# Patient Record
Sex: Male | Born: 1950 | Race: White | Hispanic: No | Marital: Married | Smoking: Current every day smoker
Health system: Southern US, Community
[De-identification: ages and names within clinical notes are randomized; demographics above are authoritative.]

## PROBLEM LIST (undated history)

## (undated) DIAGNOSIS — I10 Essential (primary) hypertension: Secondary | ICD-10-CM

## (undated) DIAGNOSIS — J449 Chronic obstructive pulmonary disease, unspecified: Secondary | ICD-10-CM

## (undated) DIAGNOSIS — Z720 Tobacco use: Secondary | ICD-10-CM

## (undated) DIAGNOSIS — R9430 Abnormal result of cardiovascular function study, unspecified: Secondary | ICD-10-CM

## (undated) DIAGNOSIS — IMO0002 Reserved for concepts with insufficient information to code with codable children: Secondary | ICD-10-CM

## (undated) DIAGNOSIS — I779 Disorder of arteries and arterioles, unspecified: Secondary | ICD-10-CM

## (undated) DIAGNOSIS — Z951 Presence of aortocoronary bypass graft: Secondary | ICD-10-CM

## (undated) DIAGNOSIS — K219 Gastro-esophageal reflux disease without esophagitis: Secondary | ICD-10-CM

## (undated) DIAGNOSIS — I255 Ischemic cardiomyopathy: Secondary | ICD-10-CM

## (undated) DIAGNOSIS — E785 Hyperlipidemia, unspecified: Secondary | ICD-10-CM

## (undated) DIAGNOSIS — K2270 Barrett's esophagus without dysplasia: Secondary | ICD-10-CM

## (undated) DIAGNOSIS — Z789 Other specified health status: Secondary | ICD-10-CM

## (undated) DIAGNOSIS — I2510 Atherosclerotic heart disease of native coronary artery without angina pectoris: Secondary | ICD-10-CM

## (undated) DIAGNOSIS — I739 Peripheral vascular disease, unspecified: Secondary | ICD-10-CM

## (undated) HISTORY — DX: Barrett's esophagus without dysplasia: K22.70

## (undated) HISTORY — DX: Hyperlipidemia, unspecified: E78.5

## (undated) HISTORY — DX: Abnormal result of cardiovascular function study, unspecified: R94.30

## (undated) HISTORY — DX: Presence of aortocoronary bypass graft: Z95.1

## (undated) HISTORY — DX: Reserved for concepts with insufficient information to code with codable children: IMO0002

## (undated) HISTORY — DX: Atherosclerotic heart disease of native coronary artery without angina pectoris: I25.10

## (undated) HISTORY — DX: Gastro-esophageal reflux disease without esophagitis: K21.9

## (undated) HISTORY — DX: Disorder of arteries and arterioles, unspecified: I77.9

## (undated) HISTORY — PX: CORONARY ARTERY BYPASS GRAFT: SHX141

## (undated) HISTORY — DX: Peripheral vascular disease, unspecified: I73.9

## (undated) HISTORY — PX: CAROTID ARTERY ANGIOPLASTY: SHX1300

## (undated) HISTORY — PX: CARDIAC SURGERY: SHX584

## (undated) HISTORY — PX: COLONOSCOPY: SHX174

## (undated) HISTORY — PX: HERNIA REPAIR: SHX51

## (undated) HISTORY — PX: UPPER GASTROINTESTINAL ENDOSCOPY: SHX188

## (undated) HISTORY — DX: Tobacco use: Z72.0

## (undated) HISTORY — DX: Essential (primary) hypertension: I10

## (undated) HISTORY — DX: Ischemic cardiomyopathy: I25.5

## (undated) HISTORY — DX: Chronic obstructive pulmonary disease, unspecified: J44.9

## (undated) HISTORY — DX: Other specified health status: Z78.9

---

## 2000-03-09 ENCOUNTER — Encounter (INDEPENDENT_AMBULATORY_CARE_PROVIDER_SITE_OTHER): Payer: Self-pay

## 2000-03-09 ENCOUNTER — Other Ambulatory Visit: Admission: RE | Admit: 2000-03-09 | Discharge: 2000-03-09 | Payer: Self-pay | Admitting: Otolaryngology

## 2001-06-21 ENCOUNTER — Ambulatory Visit (HOSPITAL_COMMUNITY): Admission: RE | Admit: 2001-06-21 | Discharge: 2001-06-21 | Payer: Self-pay | Admitting: Internal Medicine

## 2002-07-12 ENCOUNTER — Ambulatory Visit (HOSPITAL_COMMUNITY): Admission: RE | Admit: 2002-07-12 | Discharge: 2002-07-12 | Payer: Self-pay | Admitting: Internal Medicine

## 2002-12-06 ENCOUNTER — Ambulatory Visit (HOSPITAL_COMMUNITY): Admission: RE | Admit: 2002-12-06 | Discharge: 2002-12-07 | Payer: Self-pay | Admitting: Cardiology

## 2003-07-25 ENCOUNTER — Ambulatory Visit (HOSPITAL_COMMUNITY): Admission: RE | Admit: 2003-07-25 | Discharge: 2003-07-25 | Payer: Self-pay | Admitting: Internal Medicine

## 2004-03-03 ENCOUNTER — Ambulatory Visit: Payer: Self-pay | Admitting: Internal Medicine

## 2004-03-14 ENCOUNTER — Ambulatory Visit: Payer: Self-pay | Admitting: Internal Medicine

## 2004-03-14 ENCOUNTER — Ambulatory Visit (HOSPITAL_COMMUNITY): Admission: RE | Admit: 2004-03-14 | Discharge: 2004-03-14 | Payer: Self-pay | Admitting: Internal Medicine

## 2004-07-16 ENCOUNTER — Ambulatory Visit: Payer: Self-pay | Admitting: Cardiology

## 2004-07-22 ENCOUNTER — Encounter: Payer: Self-pay | Admitting: Cardiology

## 2004-07-30 ENCOUNTER — Ambulatory Visit (HOSPITAL_COMMUNITY): Admission: RE | Admit: 2004-07-30 | Discharge: 2004-07-31 | Payer: Self-pay | Admitting: Vascular Surgery

## 2004-07-30 ENCOUNTER — Encounter (INDEPENDENT_AMBULATORY_CARE_PROVIDER_SITE_OTHER): Payer: Self-pay | Admitting: Specialist

## 2004-07-30 HISTORY — PX: CAROTID ENDARTERECTOMY: SUR193

## 2005-05-07 ENCOUNTER — Ambulatory Visit: Payer: Self-pay | Admitting: Internal Medicine

## 2005-07-15 ENCOUNTER — Ambulatory Visit: Payer: Self-pay | Admitting: Cardiology

## 2005-07-21 ENCOUNTER — Ambulatory Visit: Payer: Self-pay | Admitting: Cardiology

## 2005-07-22 ENCOUNTER — Encounter: Payer: Self-pay | Admitting: Vascular Surgery

## 2005-07-22 ENCOUNTER — Ambulatory Visit (HOSPITAL_COMMUNITY): Admission: RE | Admit: 2005-07-22 | Discharge: 2005-07-22 | Payer: Self-pay | Admitting: Cardiovascular Disease

## 2005-07-22 ENCOUNTER — Ambulatory Visit: Payer: Self-pay | Admitting: Cardiovascular Disease

## 2005-07-22 ENCOUNTER — Inpatient Hospital Stay (HOSPITAL_BASED_OUTPATIENT_CLINIC_OR_DEPARTMENT_OTHER): Admission: RE | Admit: 2005-07-22 | Discharge: 2005-07-22 | Payer: Self-pay | Admitting: Cardiovascular Disease

## 2005-08-03 ENCOUNTER — Inpatient Hospital Stay (HOSPITAL_COMMUNITY): Admission: RE | Admit: 2005-08-03 | Discharge: 2005-08-06 | Payer: Self-pay | Admitting: Cardiothoracic Surgery

## 2005-08-17 ENCOUNTER — Ambulatory Visit: Payer: Self-pay | Admitting: Cardiology

## 2005-09-09 ENCOUNTER — Encounter: Payer: Self-pay | Admitting: Cardiology

## 2005-09-17 ENCOUNTER — Ambulatory Visit: Payer: Self-pay | Admitting: Cardiology

## 2006-06-16 ENCOUNTER — Ambulatory Visit: Payer: Self-pay | Admitting: Cardiology

## 2006-09-06 ENCOUNTER — Encounter (INDEPENDENT_AMBULATORY_CARE_PROVIDER_SITE_OTHER): Payer: Self-pay | Admitting: Internal Medicine

## 2006-09-06 ENCOUNTER — Ambulatory Visit: Payer: Self-pay | Admitting: Internal Medicine

## 2006-09-06 ENCOUNTER — Ambulatory Visit (HOSPITAL_COMMUNITY): Admission: RE | Admit: 2006-09-06 | Discharge: 2006-09-06 | Payer: Self-pay | Admitting: Internal Medicine

## 2006-09-21 IMAGING — CR DG CHEST 2V
2 series · 2 of 2 positions shown · non-contrast
Comparison: 07/29/04.

CLINICAL DATA: Preop for coronary artery disease surgery, hypertension, smoking history. 
 CHEST - 2 VIEW ? 07/30/05:

[view not recorded (1 of 2)]
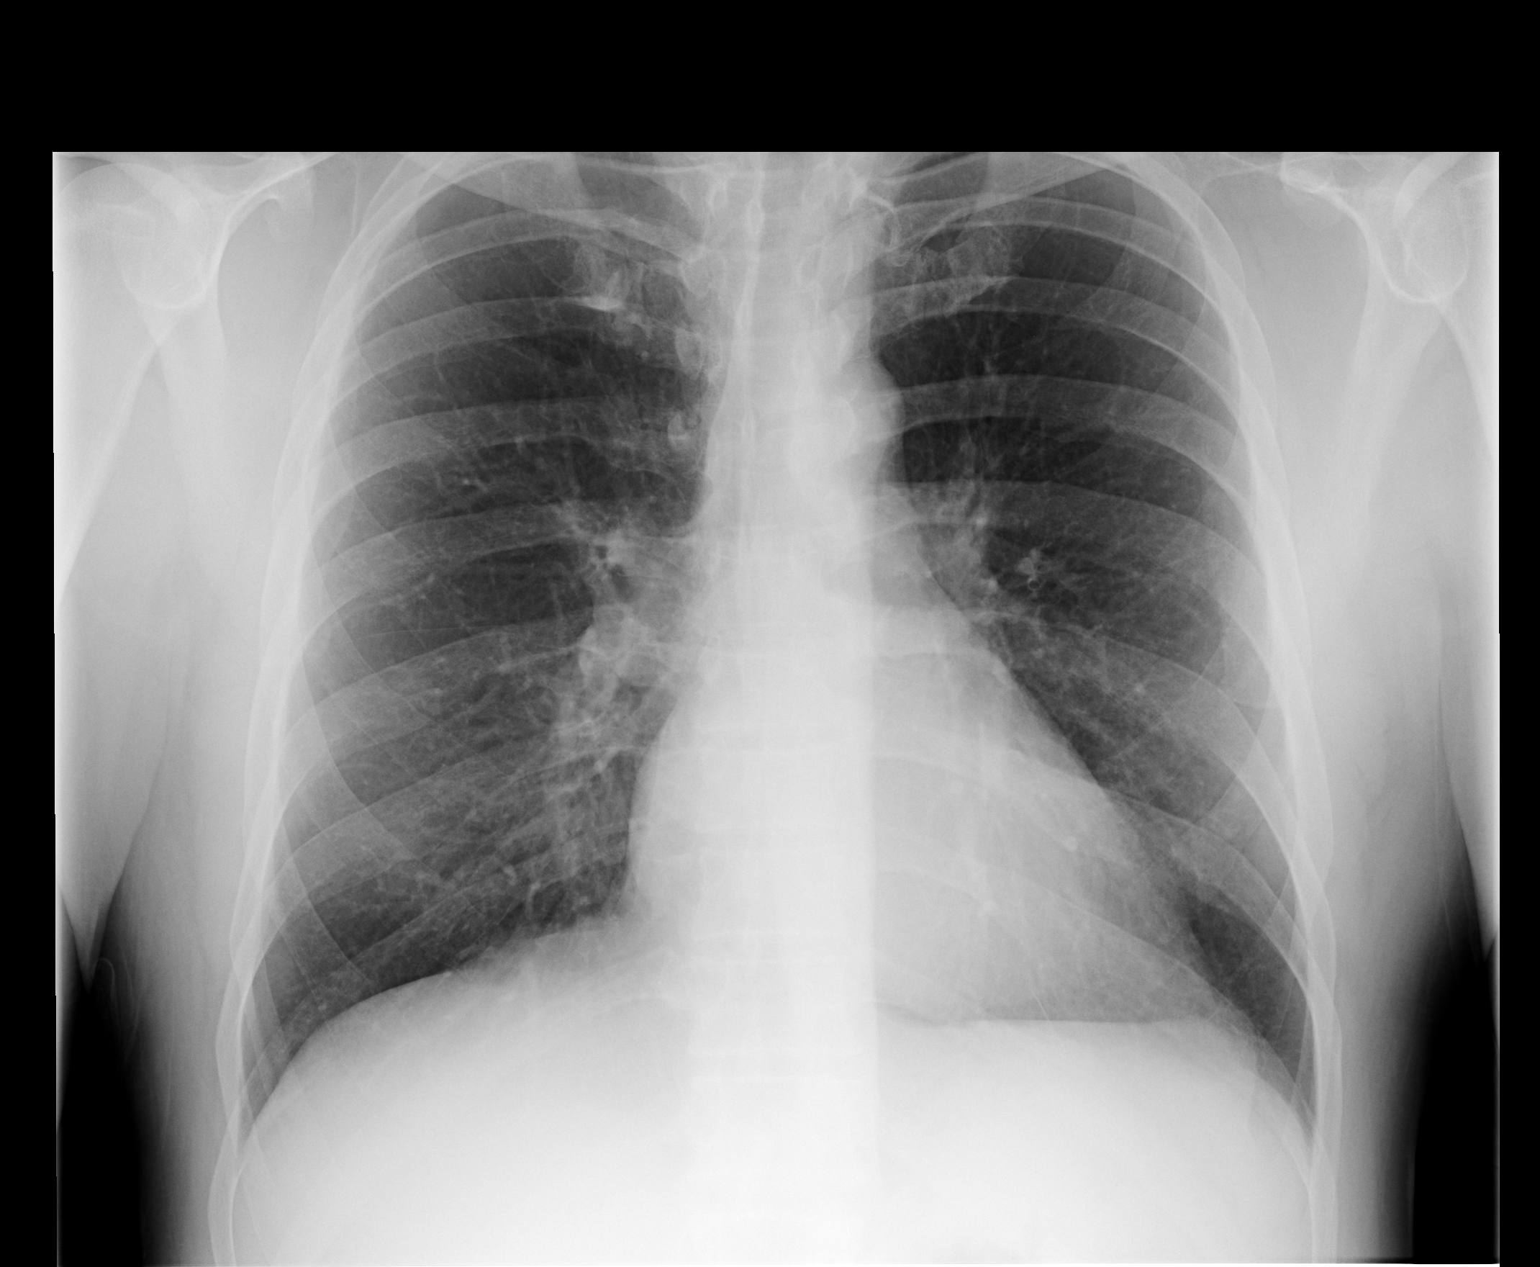

[view not recorded (2 of 2)]
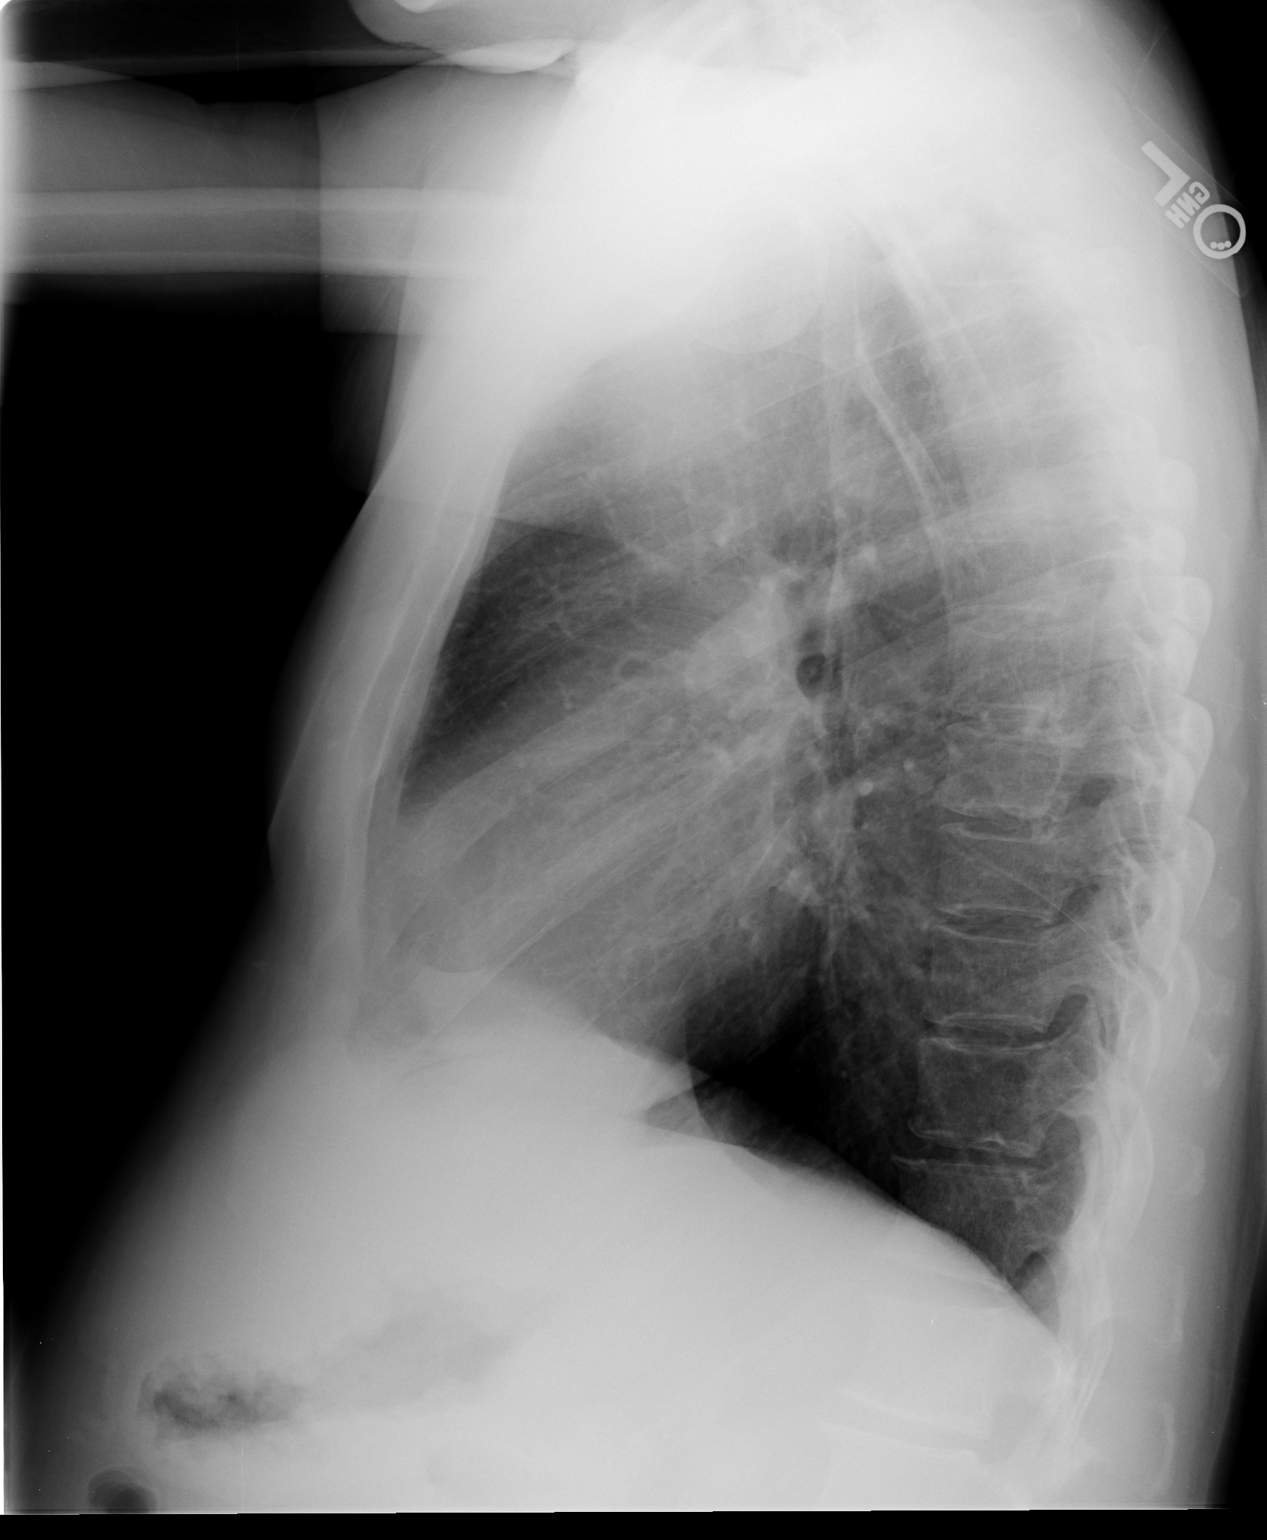

[2 of 2 positions shown; findings below may reference images not displayed]

Two views of the chest show the lungs to be clear. The heart is within upper limits of normal.  No bony abnormal is seen.
IMPRESSION: Stable chest x-ray.  No active lung disease.

## 2007-03-22 ENCOUNTER — Ambulatory Visit: Payer: Self-pay | Admitting: Vascular Surgery

## 2007-04-05 ENCOUNTER — Ambulatory Visit: Payer: Self-pay | Admitting: Vascular Surgery

## 2007-04-14 ENCOUNTER — Ambulatory Visit: Payer: Self-pay | Admitting: Cardiology

## 2007-04-22 ENCOUNTER — Observation Stay (HOSPITAL_COMMUNITY): Admission: RE | Admit: 2007-04-22 | Discharge: 2007-04-23 | Payer: Self-pay | Admitting: Vascular Surgery

## 2007-04-22 ENCOUNTER — Ambulatory Visit: Payer: Self-pay | Admitting: Vascular Surgery

## 2007-04-22 HISTORY — PX: CAROTID ENDARTERECTOMY: SUR193

## 2007-05-03 ENCOUNTER — Ambulatory Visit: Payer: Self-pay | Admitting: Vascular Surgery

## 2007-09-01 ENCOUNTER — Ambulatory Visit: Payer: Self-pay | Admitting: Cardiology

## 2007-11-08 ENCOUNTER — Ambulatory Visit: Payer: Self-pay | Admitting: Vascular Surgery

## 2008-05-15 ENCOUNTER — Ambulatory Visit: Payer: Self-pay | Admitting: Vascular Surgery

## 2008-10-12 ENCOUNTER — Ambulatory Visit: Payer: Self-pay | Admitting: Cardiology

## 2009-05-07 ENCOUNTER — Ambulatory Visit: Payer: Self-pay | Admitting: Vascular Surgery

## 2009-11-21 ENCOUNTER — Telehealth (INDEPENDENT_AMBULATORY_CARE_PROVIDER_SITE_OTHER): Payer: Self-pay | Admitting: *Deleted

## 2010-04-16 ENCOUNTER — Encounter: Payer: Self-pay | Admitting: Cardiology

## 2010-04-16 ENCOUNTER — Ambulatory Visit
Admission: RE | Admit: 2010-04-16 | Discharge: 2010-04-16 | Payer: Self-pay | Source: Home / Self Care | Attending: Cardiology | Admitting: Cardiology

## 2010-04-18 ENCOUNTER — Encounter: Payer: Self-pay | Admitting: Cardiology

## 2010-04-29 NOTE — Progress Notes (Signed)
Summary: ?? hold meds   Phone Note From Other Clinic   Caller: Dewayne Hatch, Nurse Request: Talk with Nurse Summary of Call: Ann from Dr. Patty Sermons office called to talk about Benjamin [name] having surgery. She needs to comunicate about stopping some of his meds.  please call Ann at [phone] Initial call taken by: Alexis Goodell  Follow-up for Phone Call        Received fax from [facility] with Dr. Karilyn Cota.  Want to know if can stop Plavix 5 days prior to and Aspirin 2 days prior to colonoscopy on 9/13.   Hoover Brunette, LPN  November 22, 2009 4:40 PM   Additional Follow-up for Phone Call Additional follow up Details #1::        yes but both need to be restarted as soon as possible after procedure particularly aspirin if possible, aspirin should be continued during procedure but if not, restart as soon as possible.  The patient is status post bypass surgery in his drug-eluting stent was placed in 2004 so stopping Plavix should be safe. Additional Follow-up by: Lewayne Bunting, MD, [facility],  November 24, 2009 5:54 PM

## 2010-05-01 NOTE — Assessment & Plan Note (Addendum)
Summary: 1 YR FUL   Visit Type:  Follow-up Primary Provider:  Roxine Caddy   History of Present Illness: the patient is a 60 year old male with a history of an ischemic cardiomyopathy ejection fraction 35-40%, status post coronary artery bypass grafting in 2006 and status post ICD implant in 2005. The patient reports no recurrent substernal chest pain either at rest or on exertion. He did describe an episode several weeks ago of severe cramping under her left axilla this abated spontaneously. The patient also significant carotid disease and still followed by Dr. Hart Rochester. He also has depression but states he does not feel good on Lexapro. He reports vivid dreams and feels even more depressed. He does report shortness of breath on exertion which is chronic. Unfortunate patient continues to smoke. He did have screening for abdominal aortic aneurysm in July of 2011 which was negative. His only cardiovascular complaint currently is an occasional sensation of "fluttering in his chest". Symptoms typically lasts 10 minutes and then abated spontaneously. There are no associated symptoms of shortness of breath presyncope or syncope.  Preventive Screening-Counseling & Management  Alcohol-Tobacco     Smoking Status: current     Smoking Cessation Counseling: yes     Packs/Day: 3 PPD  Current Medications (verified): 1)  Metoprolol Tartrate 25 Mg Tabs (Metoprolol Tartrate) .... Take 1/2 Tablet By Mouth Twice A Day 2)  Plavix 75 Mg Tabs (Clopidogrel Bisulfate) .... Take 1 Tablet By Mouth Once A Day 3)  Metformin Hcl 500 Mg Xr24h-Tab (Metformin Hcl) .... Take 3 Tablet By Mouth Once A Day 4)  Vytorin 10-40 Mg Tabs (Ezetimibe-Simvastatin) .... Take 1 Tablet By Mouth Once A Day 5)  Aspir-Low 81 Mg Tbec (Aspirin) .... Take 1 Tablet By Mouth Once A Day 6)  Nexium 40 Mg Cpdr (Esomeprazole Magnesium) .... Take 1 Tablet By Mouth Once A Day 7)  Lisinopril-Hydrochlorothiazide 20-12.5 Mg Tabs (Lisinopril-Hydrochlorothiazide)  .... Take 1 Tablet By Mouth Once A Day 8)  Neurontin 600 Mg Tabs (Gabapentin) .... Take 1 Tablet By Mouth Two Times A Day  Allergies (verified): No Known Drug Allergies  Comments:  Nurse/Medical Assistant: The patient's medication bottles and allergies were reviewed with the patient and were updated in the Medication and Allergy Lists.  Past History:  Past Surgical History: Last updated: 12/06/2008 Right inguinal hernia repair.  CABG Carotid Endarterectomy Tonsillectomy  Family History: Last updated: 12/06/2008 Family History of Cancer:  Family History of Coronary Artery Disease:  Family History of Hypertension:   Social History: Last updated: 12/06/2008 Retired  Married  Tobacco Use - Yes.  Alcohol Use - yes  Risk Factors: Smoking Status: current (04/16/2010) Packs/Day: 3 PPD (04/16/2010)  Past Medical History: HYPERTENSION, UNSPECIFIED (ICD-401.9) HYPERLIPIDEMIA-MIXED (ICD-272.4) CAD, ARTERY BYPASS GRAFT (ICD-414.04) Severe cerebrovascular disease status post left and right carotid endarterectomy followed by Dr. Hart Rochester Type 2 diabetes mellitus GERD Abdominal ultrasound July 2011 negative or AAA Echocardiogram February 2011 ejection fraction 35-40%, abnormal diastolic function, segmental wall motion abnormalities, ICD lead present, no definite pulmonary hypertension.  Social History: Packs/Day:  3 PPD  Review of Systems       The patient complains of shortness of breath and depression.  The patient denies fatigue, malaise, fever, weight gain/loss, vision loss, decreased hearing, hoarseness, chest pain, palpitations, prolonged cough, wheezing, sleep apnea, coughing up blood, abdominal pain, blood in stool, nausea, vomiting, diarrhea, heartburn, incontinence, blood in urine, muscle weakness, joint pain, leg swelling, rash, skin lesions, headache, fainting, dizziness, anxiety, enlarged lymph nodes, easy bruising or  bleeding, and environmental allergies.    Vital  Signs:  Patient profile:   60 year old male Height:      68 inches Weight:      165 pounds BMI:     25.18 Pulse rate:   105 / minute BP sitting:   147 / 85  (right arm) Cuff size:   regular  Vitals Entered By: Carlye Grippe (April 16, 2010 2:28 PM)  Nutrition Counseling: Patient's BMI is greater than 25 and therefore counseled on weight management options.  Physical Exam  Additional Exam:  General: Well-developed, well-nourished in no distress head: Normocephalic and atraumatic eyes PERRLA/EOMI intact, conjunctiva and lids normal nose: No deformity or lesions mouth normal dentition, normal posterior pharynx neck: Supple, no JVD.  No masses, thyromegaly or abnormal cervical nodes, status post bilateral carotid endarterectomy scars, left carotid bruit lungs: Normal breath sounds bilaterally without wheezing.  Normal percussion heart: regular rate and rhythm with normal S1 and S2, no S3 or S4.  PMI is normal.  No pathological murmurs abdomen: Normal bowel sounds, abdomen is soft and nontender without masses, organomegaly or hernias noted.  No hepatosplenomegaly musculoskeletal: Back normal, normal gait muscle strength and tone normal pulsus: Pulse is normal in all 4 extremities Extremities: No peripheral pitting edema neurologic: Alert and oriented x 3 skin: Intact without lesions or rashes cervical nodes: No significant adenopathy psychologic: Normal affect    Impression & Recommendations:  Problem # 1:  CAD, ARTERY BYPASS GRAFT (ICD-414.04) no recurrent chest pain. Patient had a stress test in 2009. Currently does not need further evaluation for ischemia The following medications were removed from the medication list:    Altace 2.5 Mg Caps (Ramipril) His updated medication list for this problem includes:    Metoprolol Tartrate 25 Mg Tabs (Metoprolol tartrate) .Marland Kitchen... Take 1/2 tablet by mouth twice a day    Plavix 75 Mg Tabs (Clopidogrel bisulfate) .Marland Kitchen... Take 1 tablet by  mouth once a day    Aspir-low 81 Mg Tbec (Aspirin) .Marland Kitchen... Take 1 tablet by mouth once a day    Lisinopril-hydrochlorothiazide 20-12.5 Mg Tabs (Lisinopril-hydrochlorothiazide) .Marland Kitchen... Take 1 tablet by mouth once a day  Orders: EKG w/ Interpretation (93000)  Problem # 2:  HYPERLIPIDEMIA-MIXED (ICD-272.4) patient has not had a lipid panel done in quite some time. We ordered a lipid panel and LFTs. His updated medication list for this problem includes:    Vytorin 10-40 Mg Tabs (Ezetimibe-simvastatin) .Marland Kitchen... Take 1 tablet by mouth once a day  Problem # 3:  HYPERTENSION, UNSPECIFIED (ICD-401.9) blood pressures were controlled. Continue current medical regimen. The following medications were removed from the medication list:    Altace 2.5 Mg Caps (Ramipril) His updated medication list for this problem includes:    Metoprolol Tartrate 25 Mg Tabs (Metoprolol tartrate) .Marland Kitchen... Take 1/2 tablet by mouth twice a day    Aspir-low 81 Mg Tbec (Aspirin) .Marland Kitchen... Take 1 tablet by mouth once a day    Lisinopril-hydrochlorothiazide 20-12.5 Mg Tabs (Lisinopril-hydrochlorothiazide) .Marland Kitchen... Take 1 tablet by mouth once a day  Problem # 4:  AORTIC ANEUR UNSPEC SITE WITHOUT MENTION RUPTURE (ICD-441.9) patient has been evaluated with a one-time screening for abdominal aortic aneurysm and this is negative. He does not need any further screening in the future.  Other Orders: Ultrasound (Ultrasound)  Patient Instructions: 1)  Metoprolol 25mg  - 1/2 tab two times a day 2)  Plavix refill 3)  Abdominal Ultrasound 4)  Follow up in  6 months Prescriptions: PLAVIX 75 MG  TABS (CLOPIDOGREL BISULFATE) Take 1 tablet by mouth once a day  #90 x 3   Entered by:   Hoover Brunette, LPN   Authorized by:   Lewayne Bunting, MD, [facility]   Signed by:   Hoover Brunette, LPN on 16/12/9602   Method used:   Print then Give to Patient   RxID:   [id number] METOPROLOL TARTRATE 25 MG TABS (METOPROLOL TARTRATE) Take 1/2 tablet by mouth twice a day  #90 x 3    Entered by:   Hoover Brunette, LPN   Authorized by:   Lewayne Bunting, MD, [facility]   Signed by:   Hoover Brunette, LPN on 21/30/8657   Method used:   Print then Give to Patient   RxID:   [id number]   Appended Document: 1 YR FUL please disregard the note above. The note was dictated on the wrong patient.  In summary the current patient is a 60 year old male with a history of multivessel coronary artery status post coronary bypass grafting in 2007. Prior taxus stenting of the ostial LAD in 2004. At the negative stress test in 2009. He has known cerebrovascular disease status post bilateral carotid endarterectomies. He has COPD. He has remained stable. Next  His physical exam is unchanged from prior. The patient will be referred however for abdominal ultrasound to rule out AAA.

## 2010-05-06 ENCOUNTER — Other Ambulatory Visit (INDEPENDENT_AMBULATORY_CARE_PROVIDER_SITE_OTHER): Payer: 59

## 2010-05-06 DIAGNOSIS — Z48812 Encounter for surgical aftercare following surgery on the circulatory system: Secondary | ICD-10-CM

## 2010-05-06 DIAGNOSIS — I6529 Occlusion and stenosis of unspecified carotid artery: Secondary | ICD-10-CM

## 2010-05-12 NOTE — Procedures (Unsigned)
CAROTID DUPLEX EXAM  INDICATION:  Bilateral carotid endarterectomies.  HISTORY: Diabetes:  Yes. Cardiac:  CABG. Hypertension:  Yes. Smoking:  Yes. Previous Surgery:  Right carotid endarterectomy on 07/30/2004, left carotid endarterectomy on 04/22/2007. CV History:  Currently asymptomatic. Amaurosis Fugax No, Paresthesias No, Hemiparesis No                                      RIGHT             LEFT Brachial systolic pressure:         126               116 Brachial Doppler waveforms:         Normal            Normal Vertebral direction of flow:        Antegrade         Antegrade DUPLEX VELOCITIES (cm/sec) CCA peak systolic                   126               75 ECA peak systolic                   111               84 ICA peak systolic                   120               115 ICA end diastolic                   36                36 PLAQUE MORPHOLOGY: PLAQUE AMOUNT:                      None              None PLAQUE LOCATION:  IMPRESSION: 1. Patent bilateral carotid endarterectomy sites with no bilateral     internal carotid artery stenosis.  Mildly increased velocities     noted in the bilateral mid internal carotid arteries, however, this     appears to be most likely due to a change in vessel diameter. 2. No significant change noted when compared to the previous exam on     05/07/2009.  ___________________________________________ Quita Skye. Hart Rochester, M.D.  CH/MEDQ  D:  05/06/2010  T:  05/06/2010  Job:  [id number]

## 2010-08-12 NOTE — Procedures (Signed)
CAROTID DUPLEX EXAM   INDICATION:  Followup known carotid artery disease.   HISTORY:  Diabetes:  Yes.  Cardiac:  CABG 08/03/2005.  Hypertension:  Yes.  Smoking:  Yes.  Previous Surgery:  Right carotid endarterectomy on 07/30/2004 and left  carotid endarterectomy on 04/22/2007.  CV History:  No.  Amaurosis Fugax No, Paresthesias No, Hemiparesis No                                       RIGHT             LEFT  Brachial systolic pressure:         124               118  Brachial Doppler waveforms:         WNL               WNL  Vertebral direction of flow:        Antegrade         Antegrade  DUPLEX VELOCITIES (cm/sec)  CCA peak systolic                   184               159  ECA peak systolic                   165               135  ICA peak systolic                   134 distal        141 mid  ICA end diastolic                   43                37  PLAQUE MORPHOLOGY:  PLAQUE AMOUNT:  PLAQUE LOCATION:   IMPRESSION:  1. Patent bilateral internal carotid arteries with no restenosis noted      status post endarterectomies.  2. Right external carotid artery stenosis.  3. Antegrade flow in bilateral vertebrals.   ___________________________________________  Quita Skye Hart Rochester, M.D.   CB/MEDQ  D:  05/07/2009  T:  05/08/2009  Job:  [id number]

## 2010-08-12 NOTE — Op Note (Signed)
NAMEDEMETRES, Benjamin [name]               ACCOUNT NO.:  [id number]   MEDICAL RECORD NO.:  [id number]          PATIENT TYPE:  INP   LOCATION:  2550                         FACILITY:  [facility]   PHYSICIAN:  Quita Skye. Hart Rochester, M.D.  DATE OF BIRTH:  1950-10-26   DATE OF PROCEDURE:  04/22/2007  DATE OF DISCHARGE:                               OPERATIVE REPORT   PREOPERATIVE DIAGNOSIS:  Severe left internal carotid stenosis --  asymptomatic.   POSTOPERATIVE DIAGNOSES:  1. Severe left internal carotid stenosis -- asymptomatic.  2. Extensive plaque in left common carotid artery.   PROCEDURE:  Extensive left common and internal carotid endarterectomy  with Dacron patch angioplasty.   SURGEON:  Quita Skye. Hart Rochester, M.D.   FIRST ASSISTANT:  Nurse.   ANESTHESIA:  General endotracheal.   BRIEF HISTORY:  This patient has previously undergone a right carotid  endarterectomy by me in 2006 and had a moderate left internal carotid  stenosis followed, which now has progressed to greater than 95% in  severity, with some elevated velocities in the left common carotid as  well.  He has no significant flow reduction on the right side and he is  scheduled for endarterectomy of this asymptomatic lesion.   PROCEDURE:  The patient was taken to the operating room and placed in a  supine position, at which time satisfactory general endotracheal  anesthesia was administered.  The left neck was prepped with Betadine  scrub and solution and draped in the routine sterile manner.  Incision  was made along the anterior border of the sternocleidomastoid muscle and  carried down through subcutaneous tissue and platysma using the Bovie.  The common facial vein and external jugular vein were ligated with 3-0  silk ties and divided, exposing the common, internal and external  carotid arteries.  Care was taken not to injure the vagus or hypoglossal  nerves, both which were exposed.  There was extensive plaque in the  proximal  internal carotid artery extending up about 4 cm; the distal  vessel appeared normal.  Common carotid also had extensive plaque  extending all way down almost to the clavicle, requiring dissection of  the entire common carotid artery in the neck.  The patient was then  heparinized.  Carotid vessels were occluded with vascular clamps and a  longitudinal opening made in the common carotid with a 15 blade and  extended up the internal carotid with Potts scissors to a point distal  to the disease.  The plaque in the internal carotid was about 90% to 95%  stenotic in severity; the distal vessel appeared normal with excellent  backbleeding.  The plaque extending down the common carotid was a smooth  plaque, but about 40% to 50% stenotic in severity until it terminated in  the proximal neck.  A #10 shunt was inserted without difficulty,  reestablishing flow in about 2 minutes.  A standard long endarterectomy  was then performed using an elevator and the Potts scissors with an  eversion endarterectomy of the external carotid.  The plaque feathered  off the distal internal carotid artery nicely, not requiring  any tacking  sutures.  The lumen was thoroughly irrigated with heparin and saline and  all loose debris carefully removed.  The arteriotomy was closed with a  patch using continuous 6-0 Prolene.  The proximal common carotid  arteriotomy was closed primarily with a continuous 6-0 Prolene up to a  point just proximal to the bifurcation.  Prior to completion of the  closure, the shunt was removed after about 45 minutes of shunt time.  Following antegrade and retrograde flushing, closure was completed with  reestablishment of flow initially up the external and up the internal  branch.  Carotid was occluded for less than 2 minutes for removal of the  shunt.  Protamine was then given to reverse the heparin.  Following  adequate hemostasis, wound was irrigated with saline and closed in  layers with  Vicryl in a subcuticular fashion and sterile dressing  applied.  The patient was taken to recovery room in satisfactory  condition.      Quita Skye Hart Rochester, M.D.  Electronically Signed     JDL/MEDQ  D:  04/22/2007  T:  04/22/2007  Job:  [id number]

## 2010-08-12 NOTE — Procedures (Signed)
CAROTID DUPLEX EXAM   INDICATION:  Follow up known carotid artery disease.   HISTORY:  Diabetes:  Yes.  Cardiac:  Coronary artery bypass graft on 08/03/05.  Hypertension:  Yes.  Smoking:  Yes.  Previous Surgery:  Right carotid endarterectomy with Dacron patch  angioplasty on 07/30/04.  Left carotid endarterectomy with Dacron patch  angioplasty on 04/22/07.  CV History:  No.  Amaurosis Fugax No, Paresthesias No, Hemiparesis No.                                       RIGHT             LEFT  Brachial systolic pressure:         124               126  Brachial Doppler waveforms:         Within normal limits                Within normal limits  Vertebral direction of flow:        Antegrade         Antegrade  DUPLEX VELOCITIES (cm/sec)  CCA peak systolic                   132               125  ECA peak systolic                   150               73  ICA peak systolic                   115 mid           128 mid  ICA end diastolic                   42                43  PLAQUE MORPHOLOGY:                  None              None  PLAQUE AMOUNT:                      None              None  PLAQUE LOCATION:                    None              None   IMPRESSION:  Patent bilateral internal carotid arteries with no evidence  of restenosis.       ___________________________________________  Quita Skye Hart Rochester, M.D.   AC/MEDQ  D:  05/15/2008  T:  05/15/2008  Job:  [id number]

## 2010-08-12 NOTE — Assessment & Plan Note (Signed)
OFFICE VISIT   Benjamin [name], Benjamin [name]  DOB:  May 27, 1950                                       11/08/2007  OZHYQ#:[id number]   The patient returns 6 months post extensive left carotid endarterectomy  with Dacron patch angioplasty for extensive left common and internal  carotid occlusive disease which was asymptomatic.  He has done well  since that time with no neurologic symptoms such as hemispheric or  nonhemispheric TIAs, amaurosis fugax, diplopia, blurred vision or  syncope.  He also denies any cardiac symptoms such as chest pain,  dyspnea on exertion, PND, orthopnea and has no claudication symptoms.  He does have burning on the soles of his feet and has been found to have  a B12 vitamin deficiency which is being treated.  He continues to smoke  3 packs of cigarettes per day and take one aspirin per day and states  that he cannot quit smoking.   PHYSICAL EXAMINATION:  Vital signs:  Blood pressure 126/74, heart rate  69, respirations 14.  Neck:  Incisions remain well-healed.  Neurological:  Normal.  Carotid pulses are 3+ and no audible bruits.  Chest:  Clear to auscultation.  Cardiovascular:  Regular rhythm with no  murmurs.  Abdomen:  Soft, nontender with no palpable masses.  Vascular:  He has 3+ femoral, popliteal and dorsalis pedis pulses palpable  bilaterally.  Both feet are well-perfused.   Carotid duplex exam reveals both internal carotids to be widely patent  with no evidence of recurrent stenosis.   He was reassured regarding these findings and will return in 6 months  for continued followup on the carotid protocol unless he develops any  symptoms in the interim.   Quita Skye Hart Rochester, M.D.  Electronically Signed   JDL/MEDQ  D:  11/08/2007  T:  11/09/2007  Job:  [id number]

## 2010-08-12 NOTE — Assessment & Plan Note (Signed)
OFFICE VISIT   [name], Benjamin C  DOB:  02-15-1951                                       05/03/2007  YQMVH#:[id number]   The patient is status post left carotid endarterectomy by me on January  23 for severe asymptomatic left internal and common carotid stenosis.  He has had no new neurologic complications and feels well today.  He is  swallowing well and has had no hoarseness.  His biggest problem was a  left posterior occipital headache, which he suffered following his  surgery, probably due to a sinus infection, which is now being treated  with antibiotics and is resolving, and the headache has almost resolved.  He has had no left brain symptoms and continues to take 1 aspirin per  day.   PHYSICAL EXAM:  Blood pressure 100/60, heart rate 68, respirations 16.  Left neck incision has healed nicely.  He has excellent carotid pulses  bilaterally with no bruits.  Neurologic exam is normal.   I have reassured him regarding these findings.  We will see him back in  6 months with a followup carotid duplex exam.  If he has any symptoms in  the interim, he will be in touch with me.   Quita Skye Hart Rochester, M.D.  Electronically Signed   JDL/MEDQ  D:  05/03/2007  T:  05/04/2007  Job:  [id number]   cc:   Junie Panning, PA-C

## 2010-08-12 NOTE — Assessment & Plan Note (Signed)
[facility]                          [facility]   NAME:Benjamin [name], Benjamin [name]                      MRN:          [id number]  DATE:09/01/2007                            DOB:          [birth year]    PRIMARY CARDIOLOGIST:  Dr. Lewayne Bunting.   REASON FOR VISIT:  Annual followup.   The patient returns to our clinic since last seen here in June 2007, by  Dr. Lewayne Bunting.   In the interim, he has undergone successful left carotid endarterectomy,  by Dr. Josephina Gip, in February of this year.  He had known history of  severe CVD, having undergone prior right carotid endarterectomy in 2006,  again by Dr. Hart Rochester, apparently following a right-brain TIA.   Prior to his most recent surgery, the patient was referred for a  preoperative stress test for clearance, which was performed here at  [facility] in mid January.  This was reviewed by Dr. Andee Lineman and was  interpreted as a normal, adequate exercise stress test; calculated EF  59%.   Clinically, Benjamin [name] reports no interim development of exertional  angina pectoris.  Unfortunately, he continues to smoke and does not  appear motivated to stop doing so.  Otherwise, patient reports  compliance with his medications.   Electrocardiogram today reveals NSR at 60 BPM with normal axis and no  ischemic changes.   MEDICATIONS:  1. Full-dose aspirin.  2. Plavix.  3. Metoprolol 12.5 daily.  4. Vytorin 10/40 daily.  5. Lisinopril/hydrochlorothiazide 20/12.5 daily.  6. Metformin 500 b.i.d.  7. Nexium 40 b.i.d.   PHYSICAL EXAMINATION:  Blood pressure 124/79, pulse 74, regular, weight  159.  GENERAL:  A 60 year old male, sitting upright, in no distress.  HEENT:  Normocephalic, atraumatic.  NECK:  Palpable bilateral carotid pulses without bruits, no JVD.  LUNGS:  Diminished breath sounds at the bases but without crackles or  wheezes.  HEART:  Regular rate and rhythm, S1 and S2.  Positive S4, no  significant  murmurs, no rubs.  ABDOMEN:  Soft, nontender, intact bowel sounds.  EXTREMITIES:  Palpable bilateral femoral pulses without bruits.  Palpable posterior tibialis pulses without edema.  NEURO:  No focal deficit.   IMPRESSION:  1. Multivessel coronary artery disease.      a.     Single-vessel coronary artery bypass grafting, May 2007:       Left internal mammary artery - diagonal branch.      b.     Status post Taxus stenting, high-grade ostial left anterior       descending in 2004.      c.     Normal, adequate exercise stress Cardiolite; ejection       fraction 59%, January 2009.  2. Severe cerebrovascular disease.      a.     Status post left carotid endarterectomy, February 2009.      b.     Status post right carotid endarterectomy in 2006, following       a right-brain transient ischemic attack.  3. Chronic obstructive pulmonary disease, ongoing tobacco.  4. Dyslipidemia.  5. Hypertension,  stable.  6. Gastroesophageal reflux disease.  7. Type 2 diabetes mellitus.   PLAN:  1. Continued aggressive secondary prevention with particular emphasis      on smoking cessation.  However, as noted above, the patient appears      disinterested in doing so at this point in time, despite my      admonition.  2. The patient is to remain on Plavix indefinitely.  He reports that      he was told this by Dr. Ofilia Neas, following his bypass surgery.      Given this, I have instructed him to down titrate aspirin initially      to 162, then 81 mg daily.  3. Aggressive lipid management, per Roxine Caddy, PAC, with target LDL of      70, or less.  4. Schedule return clinic followup with Dr. Andee Lineman in 1 year, or      sooner if needed.      Gene Serpe, PA-C  Electronically Signed      Learta Codding, MD,FACC  Electronically Signed   GS/MedQ  DD: 09/01/2007  DT: 09/01/2007  Job #: [id number]   cc:   Roxine Caddy, [facility]

## 2010-08-12 NOTE — H&P (Signed)
HISTORY AND PHYSICAL EXAMINATION   April 05, 2007   Re:  KAHIAU, SCHEWE               DOB:  [birth year]   Patient is to be admitted on Friday, the 23rd of January.   CHIEF COMPLAINT:  Severe left internal carotid stenosis, asymptomatic.   HISTORY OF PRESENT ILLNESS:  This 60 year old male patient with a  history of vascular occlusive disease has previously undergone a right  carotid endarterectomy by me in 2006 following a right brain TIA.  He  also underwent coronary artery bypass grafting by Dr. Tyrone Sage that same  year.  He has had a moderate left internal carotid stenosis, which I  have been following, and has now progressed to a severe, greater than  95% stenosis.  He remains asymptomatic, denying any hemispheric TIAs,  amaurosis fugax, diplopia, blurred vision, or syncope.  He does have  occasional tingling of both upper extremities from cervical disk  disease, which is chronic.   PAST MEDICAL HISTORY:  1. Diabetes mellitus.  2. Hypertension.  3. Hyperlipidemia.  4. Coronary artery disease, currently asymptomatic.  5. COPD.  6. History of gastroesophageal reflux disease.  7. Previous history of ethanol abuse.   PREVIOUS SURGERY:  1. Right inguinal hernia repair.  2. Tonsillectomy.  3. Right carotid endarterectomy.  4. Coronary artery bypass grafting.   FAMILY HISTORY:  Negative for coronary artery disease, diabetes, and  stroke.   SOCIAL HISTORY:  He is married.  He has three children.  He has retired  from [employer].  He smokes 2-3 packs of cigarettes per day for 45+  years.  He drinks 4-5 beers a day.   ALLERGIES:  None known.   MEDICATIONS:  1. Nexium 40 mg 2 times a day.  2. Plavix 75 mg daily.  3. Vytorin 10/40 1 a day.  4. Ramipril __________  5 mg daily.  5. Metformin 500 mg 2 times a day.  6. Metoprolol 25 mg 1/2 twice daily.  7. Aspirin 325 mg a day.  8. Celebrex 200 mg p.r.n.   REVIEW OF SYSTEMS:  Patient has had surgical  disk symptoms, including  tingling in his upper extremities and neck pain, which has been  evaluated by Dr. Phoebe Perch.  Denies any current dyspnea on exertion, PND,  or orthopnea but does have occasional chest tightness, which has been  chronic.  He has a history of some esophageal reflux.  Denies any lower  extremity claudication.  No urinary symptoms or chronic COPD-type  symptoms.   PHYSICAL EXAMINATION:  Blood pressure 160/80, heart rate is 88,  respirations 16.  Generally, he is a middle-aged male in no apparent  distress.  Alert and oriented x3.  Neck is supple with 3+ carotid  pulses.  No bruits are audible on either side.  Right neck incision is  well healed.  Neurologic exam is normal.  No palpable adenopathy in the  neck.  Chest:  Clear to auscultation.  Cardiovascular:  Regular rhythm  with no murmurs.  Upper extremity pulses are 3+ bilaterally.  His  abdomen is soft and nontender with no palpable mass.  He has 3+ femoral  and popliteal pulses bilaterally.   Carotid duplex exam was performed in the [facility] office on April 05, 2007  which revealed a 95% left internal carotid stenosis.  No flow reduction  in the right internal carotid.   IMPRESSION:  1. Severe left internal carotid stenosis, asymptomatic.  2. Coronary artery disease,  asymptomatic.  3. Chronic obstructive pulmonary disease.  4. Hypertension.  5. Hyperlipidemia.  6. Diabetes mellitus.   PLAN:  Admit the patient on January 23 for an elective left carotid  endarterectomy.  He will undergone a Cardiolite test preoperatively  through Dr. Margarita Mail office between now and the time of surgery.   Quita Skye Hart Rochester, M.D.  Electronically Signed   JDL/MEDQ  D:  04/05/2007  T:  04/06/2007  Job:  [id number]   cc:   Learta Codding, MD,FACC  Luis Abed, MD, [facility]  Dr. Bunnie Pion

## 2010-08-12 NOTE — Procedures (Signed)
CAROTID DUPLEX EXAM   INDICATION:  Followup, carotid artery disease.   HISTORY:  Diabetes:  Borderline.  Cardiac:  CABG on 08/03/2005.  Hypertension:  Yes.  Smoking:  2-3 packs per day for 45+ years.  Previous Surgery:  Right carotid endarterectomy with DPA on 07/30/2004  by Dr. Hart Rochester.  CV History:  Amaurosis Fugax No, Paresthesias No, Hemiparesis No                                       RIGHT             LEFT  Brachial systolic pressure:         130               124  Brachial Doppler waveforms:         Triphasic         Triphasic  Vertebral direction of flow:        Antegrade         Antegrade  DUPLEX VELOCITIES (cm/sec)  CCA peak systolic                   87                132  ECA peak systolic                   126               162  ICA peak systolic                   75                464  ICA end diastolic                   32                152  PLAQUE MORPHOLOGY:                  Mixed             Calcified  PLAQUE AMOUNT:                      Mild              Severe  PLAQUE LOCATION:                    ECA               ICA, ECA   IMPRESSION:  1. Bilateral external carotid artery stenoses, left greater than      right.  2. 80-99% left internal carotid artery stenosis.  3. No right internal carotid artery stenosis, status post      endarterectomy.   The preliminary results were shown to Dr. Edilia Bo, and he stated that  patient could be seen by Dr. Hart Rochester.  An appointment was made on April 05, 2007.  Patient was told symptoms for TIA and to call if any of these  symptoms occurred.   ___________________________________________  Quita Skye Hart Rochester, M.D.   DP/MEDQ  D:  03/22/2007  T:  03/23/2007  Job:  [id number]

## 2010-08-12 NOTE — Op Note (Signed)
NAMEDICKSON, KOSTELNIK               ACCOUNT NO.:  [id number]   MEDICAL RECORD NO.:  [id number]          PATIENT TYPE:  AMB   LOCATION:  DAY                           FACILITY:  [facility]   PHYSICIAN:  Lionel December, M.D.    DATE OF BIRTH:  September 29, 1950   DATE OF PROCEDURE:  09/06/2006  DATE OF DISCHARGE:                               OPERATIVE REPORT   PROCEDURE:  Esophagogastroduodenoscopy with esophageal biopsy.   INDICATIONS:  Benjamin [name] is a 60 year old Caucasian male with chronic GERD  complicated by short-segment Barrett's esophagus, who is here for  surveillance exam.  He is presently maintained on double-dose PPI along  with antireflux measures and his symptoms are well-controlled.  He has  about a 2 x 2 cm patch of Barrett's mucosa, which was last biopsied in  April 2005.  It suggested low-grade dysplasia.  He did not undergo  repeat EGD 6 months later and I am not sure why.   The procedure risks were reviewed with the patient and informed consent  was obtained.   MEDICATIONS FOR CONSCIOUS SEDATION:  Benzocaine spray for pharyngeal  topical anesthesia, Demerol 50 mg IV, Versed 5 mg IV.   FINDINGS:  Procedure performed in endoscopy suite.  The patient's vital  signs and O2 saturation were monitored during procedure and remained  stable.  The patient was placed in left lateral position and Pentax  video scope was passed via oropharynx without any difficulty into  esophagus.   Esophagus:  He had a single patch of Barrett's mucosa proximal to the GE  junction.  This was about 15 mm in maximal length and appeared to be  smaller than on a previous study of 3 years ago, but that was Olympus  system and this is a Pentax system.  There was another small patch  across from it which was 6-7 mm in length.  The GE junction was at 39 cm  and hiatus was 41.  No ulceration, stricture, or mass was noted to this  segment.   Stomach:  It was empty and distended very well with insufflation.   Folds  of the proximal stomach were normal.  Examination of the mucosa revealed  some prepyloric erythema but no erosions or ulcers noted.  Angularis,  fundus and cardia were examined by retroflexing the scope and were  normal.   Duodenum:  Bulbar mucosa was normal.  Scope was passed into the second  part of the duodenum, where mucosa and folds were normal.   Endoscope was withdrawn.  A biopsy was taken from this patch of  Barrett's mucosa for routine histology and the endoscope was withdrawn.  The patient tolerated the procedure well.   FINAL DIAGNOSES:  1. Short-segment Barrett's esophagus, which appears to be smaller, if      anything, but this may be a function of using two different systems      (Olympus and currently Pentax).  Random biopsies taken looking for      dysplasia (Pentax system).  Biopsy taken for routine histology.  2. Small sliding hiatal hernia.  3. Prepyloric erythema but no evidence  of erosions or ulcers.   RECOMMENDATIONS:  He will continue antireflux measures and Nexium at 40  mg b.i.d.  I will be contacting the patient with results of biopsy.  Presuming it is negative for dysplasia, he will return for a follow-up  exam in 3 years from now.      Lionel December, M.D.  Electronically Signed     NR/MEDQ  D:  09/06/2006  T:  09/06/2006  Job:  [id number]   cc:   Donzetta Sprung  Fax: [phone]

## 2010-08-12 NOTE — Procedures (Signed)
CAROTID DUPLEX EXAM   INDICATION:  Follow-up evaluation of known carotid artery disease.   HISTORY:  Diabetes:  Borderline.  Cardiac:  Coronary artery bypass graft on 08/03/05.  Hypertension:  Yes.  Smoking:  2-3 packs per day for over 45 years.  Previous Surgery:  Left carotid endarterectomy with Dacron patch  angioplasty on 04/22/07.  Right carotid endarterectomy with Dacron patch  angioplasty on 07/30/04.  CV History:  Patient reports no cerebrovascular symptoms at this time.  Amaurosis Fugax No, Paresthesias No, Hemiparesis No.                                       RIGHT             LEFT  Brachial systolic pressure:         126               130  Brachial Doppler waveforms:         Triphasic         Triphasic  Vertebral direction of flow:        Antegrade         Antegrade  DUPLEX VELOCITIES (cm/sec)  CCA peak systolic                   129               90  ECA peak systolic                   137               58  ICA peak systolic                   81                65  ICA end diastolic                   26                25  PLAQUE MORPHOLOGY:                  None              None  PLAQUE AMOUNT:                      None              None  PLAQUE LOCATION:                    None              None   IMPRESSION:  No internal carotid artery stenosis, status post  endarterectomy bilaterally.   ___________________________________________  Benjamin [name], M.D.   MC/MEDQ  D:  11/08/2007  T:  11/08/2007  Job:  [id number]

## 2010-08-12 NOTE — Discharge Summary (Signed)
Benjamin [name], Benjamin [name]               ACCOUNT NO.:  [id number]   MEDICAL RECORD NO.:  [id number]          PATIENT TYPE:  INP   LOCATION:  3305                         FACILITY:  [facility]   PHYSICIAN:  Quita Skye. Hart Rochester, M.D.  DATE OF BIRTH:  01/13/1951   DATE OF ADMISSION:  04/22/2007  DATE OF DISCHARGE:  04/23/2007                               DISCHARGE SUMMARY   CHIEF COMPLAINT:  Severe left internal carotid stenosis - asymptomatic.   PROCEDURE PERFORMED:  Left carotid endarterectomy with Dacron patch  angioplasty by Dr. Hart Rochester on January 23.   HISTORY OF PRESENT ILLNESS:  This 60 year old male patient with a  history of diffuse vascular occlusive disease had previously undergone a  right carotid endarterectomy by Dr. Hart Rochester in 2006 following a right  brain TIA.  He also underwent coronary artery bypass grafting by Dr.  Tyrone Sage in the same year.  He had had a moderate left internal carotid  stenosis,  followed by duplex scanning, which had progressed to a severe  state greater than 95%, and he remained asymptomatic with no left brain  TIAs, amaurosis fugax or syncope by history.  He was scheduled for an  elective left carotid endarterectomy.   PAST MEDICAL HISTORY:  1. Diabetes.  2. Hypertension.  3. Hyperlipidemia.  4. Coronary artery disease, currently asymptomatic.  5. COPD.  6. History of gastroesophageal reflux.  7. Previous history of ethanol abuse.   MEDICATIONS:  1. Nexium 40 mg 2 times a day.  2. Plavix 75 mg daily.  3. Vytorin 10/40 one daily.  4. Ramipril 5 mg 1 daily.  5. Metformin 500 mg 2 times a day.  6. Metoprolol 25 mg 1/2 twice daily.  7. Aspirin 325 mg a day.  8. Celebrex 200 mg p.r.n.   HOSPITAL COURSE:  The patient was admitted on January 23, taken to the  operating room, where he underwent an uneventful left carotid  endarterectomy by Dr. Hart Rochester.  He had diffuse left common carotid  occlusive disease as well as severe stenosis in the left internal  carotid artery requiring a long arteriotomy and a Dacron patch  angioplasty.  He awoke promptly in the operating room following  extubation and was neurologically intact and remained hemodynamically  stable.  His blood pressures did run on the low side, which he also had  preoperatively with a systolic in the 90 range in the recovery room,  requiring some volume replacement as well as ephedrine, and this  stabilized.  The patient was transferred to 3300, where he remained  overnight.  Monitoring lines were removed the next morning.  He  tolerated a diet well, ambulated, voided, and remained neurologically  intact and hemodynamically stable.  He was discharged home on the first  postoperative day to return to see Dr. Hart Rochester in 2 weeks.   DISCHARGE INSTRUCTIONS:  1. Clean the neck with soap and water.  2. Keep the incision dry for 2 days.  3. Do not drive an automobile for 2 weeks.  4. Slowly return to normal activities and return to see Dr. Hart Rochester in  2 weeks.   FINAL DIAGNOSES:  1. Severe left internal carotid stenosis - asymptomatic.  2. Hypertension.  3. Coronary artery disease.  4. Hyperlipidemia.  5. Chronic obstructive pulmonary disease.  6. History of gastroesophageal reflux.   CONDITION ON DISCHARGE:  Improved.      Quita Skye Hart Rochester, M.D.  Electronically Signed     JDL/MEDQ  D:  04/22/2007  T:  04/23/2007  Job:  [id number]   cc:   Learta Codding, MD,FACC  Dr. Bunnie Pion

## 2010-08-12 NOTE — Assessment & Plan Note (Signed)
[facility]                          [facility] NOTE   NAME:Benjamin [name], Benjamin [name]                      MRN:          [id number]  DATE:10/12/2008                            DOB:          Mar 01, 1951    REFERRING PHYSICIAN:  Roxine Caddy, PA   HISTORY OF PRESENT ILLNESS:  The patient is a 60 year old male with  coronary artery disease, status post coronary bypass grafting.  The  patient unfortunately continues to smoke.  Clinically, he is doing well  with no recurrent substernal chest pain, shortness of breath, orthopnea,  or PND.  The patient had a stress test last year which was within normal  limits.  His EKG today also in the office within normal limits.  The  patient has known cerebrovascular disease and underwent bilateral  carotid and endarterectomy with recent Dopplers that show patency.  He  has also been evaluated by Roxine Caddy with lower extremity Dopplers in  their office and this reportedly was also within normal limits per the  patient's report.   MEDICATIONS:  1. Nexium 40 mg p.o. b.i.d.  2. Plavix 75 mg p.o. daily.  3. Vytorin 10/40 daily.  4. Lisinopril/hydrochlorothiazide 20/12.5 mg p.o. daily.  5. Metformin 500 mg p.o. t.i.d.  6. Aspirin 81 mg p.o. daily.  7. Metoprolol 25 mg half a tablet p.o. b.i.d.   PHYSICAL EXAMINATION:  VITAL SIGNS:  Blood pressure 154/95, heart rate  69, weight 162 pound.  NECK:  Normal carotid upstroke and no carotid bruits.  LUNGS:  Clear breath sounds bilaterally.  HEART:  Regular rate and rhythm with normal S1 and S2.  No murmurs,  rubs, or gallops.  ABDOMEN:  Soft, nontender.  No rebound or guarding.  Good bowel sounds.  EXTREMITIES:  No cyanosis, clubbing, or edema.  NEURO:  The patient is alert, oriented, and grossly nonfocal.   PROBLEM LIST:  1. Multivessel coronary artery disease.      a.     Status post single-vessel coronary bypass graft in May 2007       with a left internal mammary  artery to diagonal branch.      b.     Status post Taxus stenting of high-grade ostial left       anterior descending artery 2004.      c.     Normal adequate exercise stress test, ejection fraction 59%,       January 2009.  2. Severe cerebrovascular disease.      a.     Status post left carotid endarterectomy, February 2009.      b.     Status post right carotid endarterectomy in 2006, following       a right brain transient ischemic attack.      c.     Chronic obstructive pulmonary disease, ongoing tobacco use  3. Dyslipidemia, stable.  4. Hypertension, uncontrolled.  5. Gastroesophageal reflux disease.  6. Type 2 diabetes mellitus.   PLAN:  1. I point that the patient's blood pressure is elevated, but he      assures me that normally it runs  within normal range.  He could      have a component of white-coat hypertension.  I have asked him to      follow up on this closely in the next couple of weeks and to report      results to [place].  He may need doubling of his lisinopril      hydrochlorothiazide.  2. The patient was again instructed to stop tobacco use.  3. The patient can continue on his current medical regimen.  I have      made no changes.  He seems to be stable from a cardiovascular      perspective.  He will need Plavix indefinitely.     Learta Codding, MD,FACC  Electronically Signed    GED/MedQ  DD: 10/12/2008  DT: 10/12/2008  Job #: [id number]   cc:   Roxine Caddy, PA

## 2010-08-15 NOTE — H&P (Signed)
NAME:  Benjamin [name], Benjamin [name] NO.:  [id number]   MEDICAL RECORD NO.:  [id number]                  PATIENT TYPE:   LOCATION:                                       FACILITY:   PHYSICIAN:  Lionel December, M.D.                 DATE OF BIRTH:  02/14/1951   DATE OF ADMISSION:  DATE OF DISCHARGE:                                HISTORY & PHYSICAL   CHIEF COMPLAINT:  Due for colonoscopy.   HISTORY OF PRESENT ILLNESS:  The patient is a 60 year old, Caucasian  gentleman who presents the day prior to three-year followup colonoscopy.  He  has a history of  a tubular adenoma removed from the sigmoid colon by Dr.  Gabriel Cirri in March 2001.  He is due for a colonoscopy this month.  He states  he has been doing fairly well overall.  His heartburn symptoms are well-  controlled as long as he takes his Nexium.  He denies any abdominal pain,  dysphagia, odynophagia, nausea, vomiting or melena.  His bowels move  regularly.  He has noted two occasions over the last week where he has had  bright red blood mixed in the stools in small volume.  Associated with this,  has been left lower quadrant abdominal pain.  He has had no fevers or  chills.  The pain has since resolved.  His weight has been stable.  He  consumes approximately one beer daily.  He smokes two packs of cigarettes  daily.   MEDICATIONS:  Nexium 40 mg q.d.   ALLERGIES:  No known drug allergies.   PAST MEDICAL HISTORY:  1. Chronic gastroesophageal reflux disease.  2. Barrett's esophagus.  Last EGD was in March 2003.  3. He had a small sliding hiatal hernia.  EGD done to follow up recent upper     GI bleed felt to be due to Mallory-Weiss tear.  4. Biopsies from Barrett's esophagus in March 2002, but no dysplasia noted.  5. History of tubular adenoma removed by Dr. Gabriel Cirri in March 2001.  6. Benign polyps or nodules removed by Dr. Haroldine Laws.  7. Hypercholesterolemia presently on no therapy.   FAMILY HISTORY:   Mother died of gastric carcinoma.  No family history of  colorectal cancer.   SOCIAL HISTORY:  He is married and has three children.  He smokes two pack  of cigarettes daily.  He consumes approximately one beer daily.   REVIEW OF SYMPTOMS:  GASTROINTESTINAL:  See HPI.  GENERAL:  Denies any  weight loss.  CARDIOPULMONARY:  Denies any shortness of breath or chest  pain.   PHYSICAL EXAMINATION:  VITAL SIGNS:  Weight 158, blood pressure 124/80,  pulse 80.  GENERAL:  Pleasant, well-developed, well-nourished, Caucasian male in no  acute distress.  SKIN:  Warm and dry.  No jaundice.  HEENT:  Conjunctivae are pink.  Sclerae nonicteric.  Oropharyngeal mucosa  moist and pink.  No lesions, erythema or exudate.  No lymphadenopathy or  thyromegaly.  He has some teeth missing in the upper jaw.  CHEST:  Lungs clear to auscultation.  CARDIAC:  Regular rate and rhythm with normal S1, S2.  No murmur, rub or  gallop.  ABDOMEN:  Positive bowel sounds, soft, nontender, nondistended, no  organomegaly or masses.  EXTREMITIES:  No edema.   IMPRESSION:  1. Chronic gastroesophageal reflux disease with Barrett's esophagus.  Last     esophagogastroduodenoscopy was one year ago.  Last biopsies from his     distal esophagus/Barrett's esophagus was two years ago with no evidence     of dysplasia.  Gastroesophageal reflux disease symptoms well-controlled     with Nexium.  2. Recent rectal bleeding associated with left lower quadrant abdominal     pain.  He had small volume hematochezia.  Etiology was unclear at this     time.  He does have a history of hemorrhoids and has been having rectal     itching.  Will further assess at the time of colonoscopy.  3. History of adenomatous polyps due for surveillance colonoscopy at this     time.   PLAN:  1. He will continue Nexium 40 mg p.o. q.d.  2. Colonoscopy in the near future.  3. His last EGD was one year ago.  Last biopsies from the Barrett's was two     years  ago.  The patient probably does not need to have EGD at this time     for biopsies, rule out dysplasia, although may proceed at time of     colonoscopy if Dr. Karilyn Cota feels this is appropriate.     Tana Coast, P.A.                        Lionel December, M.D.    LL/MEDQ  D:  05/30/2002  T:  05/30/2002  Job:  [id number]

## 2010-08-15 NOTE — Op Note (Signed)
[name], [name]               ACCOUNT NO.:  [id number]   MEDICAL RECORD NO.:  [id number]           PATIENT TYPE:   LOCATION:                                 FACILITY:   PHYSICIAN:  Quita Skye. Hart Rochester, M.D.       DATE OF BIRTH:   DATE OF PROCEDURE:  07/30/2004  DATE OF DISCHARGE:                                 OPERATIVE REPORT   PREOPERATIVE DIAGNOSIS:  Severe right internal carotid stenosis with right  hemispheric transient ischemic attack.   POSTOPERATIVE DIAGNOSIS:  Severe right internal carotid stenosis with right  hemispheric transient ischemic attack.   OPERATION:  Right carotid endarterectomy with Dacron patch angioplasty.   SURGEON:  Quita Skye. Hart Rochester, M.D.   FIRST ASSISTANT:  Balinda Quails, M.D.   SECOND ASSISTANT:  Rowe Clack, P.A.-C.   ANESTHESIA:  General endotracheal.   The patient was recently found to have a left carotid bruit and a duplex  scan revealed a 95 plus percent right internal carotid stenosis and a  moderate left internal carotid stenosis.  He then suffered a right brain TIA  consisting of transient heaviness and mild clumsiness in the left upper  extremity which resolved in a few minutes and has not recurred.  He is  scheduled now for right carotid endarterectomy.   DESCRIPTION OF PROCEDURE:  The patient was taken to the operating room and  placed in the supine position at which time satisfactory general  endotracheal anesthesia was administered.  The right neck was prepped with  Betadine scrub and solution and draped in the routine sterile manner.  An  incision was made along the anterior border of the sternocleidomastoid  muscle and carried down through the subcutaneous tissue and platysma using  the Bovie.  The common facial vein and external jugular veins were ligated 3-  0 silk ties and divided, exposing the common, internal and external carotid  arteries.  Care was taken not to injure the vagus or hypoglossal nerves both  of which were  exposed.  There was a calcified atherosclerotic plaque  beginning just distal to the carotid bifurcation, extending up the internal  carotid artery for about 3-4 cm posteriorly beyond the crossing of the  hypoglossal nerve.  A #10 shunt was prepared and the patient was  heparinized.  The carotid vessels were occluded with vascular clamps, a  longitudinal opening made in the common carotid with a 15 blade and extended  up the internal carotid with the Potts scissors to a point distal to the  disease.  The distal vessel appeared normal.  The #10 shunt was inserted  without difficulty, reestablishing flow in about 2 minutes.  Standard  endarterectomy was then performed using an elevator and the Potts scissors  with an eversion endarterectomy in the external carotid.  The plaque  feathered off the distal internal carotid artery fairly well and a few 7-0  Prolene tacking sutures were placed in the distal intima.  After thoroughly  irrigating with heparin saline, the arteriotomy was closed with a patch  using a continuous 6-0 Prolene.  Prior to  completion of closure, the shunt  was removed after about an hour of shunt time following antegrade and  retrograde flushing.  Closure was completed, reestablishing flow in the  external and internal branch.  The carotid  was occluded for less than 2 minutes for removal of the shunt.  Protamine  was then given to reverse the heparin.  Following adequate hemostasis, the  wound was irrigated with saline and closed in layers with Vicryl in  subcuticular fashion.  A sterile dressing was applied.  The patient was  taken to recovery in satisfactory condition.                                        ___________________________________________  Quita Skye Hart Rochester, M.D.    JDL/MEDQ  D:  07/30/2004  T:  07/30/2004  Job:  [id number]   cc:   Willa Rough, M.D.

## 2010-08-15 NOTE — Cardiovascular Report (Signed)
NAMEHAMAD, WHYTE               ACCOUNT NO.:  [id number]   MEDICAL RECORD NO.:  [id number]          PATIENT TYPE:  OIB   LOCATION:  1965                         FACILITY:  [facility]   PHYSICIAN:  Charlton Haws, M.D.     DATE OF BIRTH:  02-19-1951   DATE OF PROCEDURE:  07/22/2005  DATE OF DISCHARGE:                              CARDIAC CATHETERIZATION   INDICATIONS FOR PROCEDURE:  Previous stent to the proximal LAD with markedly  positive Myoview with anterior wall ischemia.  Cine catheterization is done  with 4 French catheters from right femoral artery.  Left main coronary  artery was normal.   There was an ostial 95% stenosis of the LAD proximal to the stent.  The  stent itself had 30% in-stent restenosis.  Distal to the stent there was  another 95% lesion before the first diagonal branch.  There was filling of  the distal LAD but there were also right-to-left collaterals to the LAD.   The circumflex coronary artery was nondominant and normal.  There was a  large obtuse marginal branch and a large AV groove branch.   Right coronary artery was dominant and normal.   RAO ventriculography:  RAO ventriculography was normal.  EF was 60%.  There  was no gradient across the aortic valve and no MR.  Aortic pressure is  120/69, LV is 125/11.   IMPRESSION:  Films were reviewed with Dr. Sherlyn Lick and we both agreed that  particularly given the patient's continued smoking and lack of compliance  with Plavix as well as the ostial stenosis abutting the left main that he  should have left internal mammary artery to the left anterior descending.   He has not had a lot of chest pain and is quite stable.  He also has some  faint right-to-left collaterals.  We both thought it was safe for him to be  discharged.  We will do precoronary artery bypass grafting  Dopplers before  he goes and [facility] has been called and is willing to see him tomorrow.           ______________________________  Charlton Haws,  M.D.     PN/MEDQ  D:  07/22/2005  T:  07/23/2005  Job:  [id number]   cc:   Learta Codding, M.D. [facility]  [address]  [address]  [place]  [place] [address]   Franklyn Lor, MD  Fax: [phone]

## 2010-08-15 NOTE — H&P (Signed)
NAMEERRIK, MITCHELLE               ACCOUNT NO.:  [id number]   MEDICAL RECORD NO.:  [id number]           PATIENT TYPE:   LOCATION:                                 FACILITY:   PHYSICIAN:  Lionel December, M.D.    DATE OF BIRTH:  [birth year]   DATE OF ADMISSION:  DATE OF DISCHARGE:  LH                                HISTORY & PHYSICAL   OFFICE NOTE:   PRESENTING COMPLAINT:  Followed for chronic GERD.  The patient has Barrett's  with low grade-dysplasia.   HISTORY OF PRESENT ILLNESS:  Benjamin [name] is a 60 year old Caucasian male patient  of Dr. Aurther Loft Daniel/Scott Leavy Cella, P.A.-C., who is here for a scheduled visit.  He has chronic GERD complicated by short-segment Barrett's esophagus.  He  had surveillance EGD with biopsy on July 25, 2003.  He had 2 x 2 cm patch  of Barrett's mucosa.  Proximal margin of Barrett's was at 39, GE junction  was at 41, and he had a 2 cm size hiatal hernia.  Biopsy revealed changes  suggestive of low-grade dysplasia.  The patient's PPI was doubled.  He  states he has done well since Nexium was increased to 40 mg b.i.d.  He  rarely has heartburn or regurgitation.  He also denies dysphagia,  hoarseness, or chronic cough.  He has had a good appetite.  He continues to  smoke cigarettes and drinks no more than one or two cans of beer every  night.  He also had colonoscopy at the time of EGD, which revealed  internal/external hemorrhoids, felt to be the source of his hematochezia.  He has a prior history of tubular adenomas.  He had two adenomas removed in  March 2000 by Dr. Gabriel Cirri.  Both of these are in the sigmoid colon.  The  patient has only one complaint.  The patient has two complaints.  Sometimes  he wakes up at night with a dry mouth, although he has not had any  regurgitation.  This morning he noted fresh blood on his underpants.  He  states over the weekend he has been working outside and chopping wood.  He  has not had any anorectal discomfort, constipation, or  diarrhea.   He is on Nexium 40 mg b.i.d., Altace 2.5 mg daily, ASA 325 mg daily, and  Vytorin 10/10 mg daily.   PAST MEDICAL HISTORY:  1.  He has chronic GERD complicated by 2 cm long Barrett's esophagus as      above.  2.  He had benign polyps removed from his vocal cords.  3.  History of colonic polyps as above.  None were found on the last      colonoscopy of March 2004.  4.  He has hypercholesterolemia.   ALLERGIES:  None known.   FAMILY HISTORY:  Negative for colorectal carcinoma.   SOCIAL HISTORY:  He is married.  He has three children.  He works at  [employer].  He has been smoking for years.  He has cut down to one to two  packs per day.  He drinks no more than two  cans of beer every night.   OBJECTIVE:  GENERAL:  A pleasant, well-nourished, well-developed Caucasian  male who is in no acute distress.  VITAL SIGNS:  He weighs 170 pounds.  He is 5 feet 9 inches tall.  Pulse 76  per minute, blood pressure 150/90.  He is afebrile.  HEENT:  Conjunctivae are pink.  Sclerae are nonicteric.  Oropharyngeal  mucosa is normal.  NECK:  No neck masses or thyromegaly noted.  CARDIAC:  Regular rhythm.  Normal S1 and S2.  No murmur or gallop noted.  CHEST:  Lungs are clear to auscultation.  ABDOMEN:  Protuberant but soft and nontender without organomegaly or masses.  RECTAL:  Examination of perianal skin reveals no abnormality.  Digital exam  is not performed.  EXTREMITIES:  No peripheral edema or clubbing noted.   ASSESSMENT:  1.  Chronic gastroesophageal reflux disease complicated by short-segment      Barrett's esophagus.  Biopsies from this patch in April 2005 reveal low-      grade dysplasia.  He has been maintained on double-dose proton pump      inhibitor, and symptoms are well-controlled.  He needs to undergo a      repeat biopsy to make sure that these changes have resolved.  2.  Episode of hematochezia this morning, suspected secondary to      hemorrhoids.  He has history  of colonic polyps; however, his colonoscopy      about a year and a half ago was unremarkable other than      internal/external hemorrhoids.  If he has another episode or two of      bleeding prior to his esophagogastroduodenoscopy, may consider flexible      sigmoidoscopy.   RECOMMENDATIONS:  1.  Antireflux measures reinforced.  2.  He will continue Nexium at 40 mg b.i.d., prescription given for 60 with      11 refills.  3.  The patient advised to cut back on his cigarette smoking, quit smoking      and also keep his alcohol intake minimum.  Both of these are considered      to be risk factors for carcinoma in patients with Barrett's esophagus.  4.  EGD with biopsy to be performed in the near future.     Benjamin [name]   NR/MEDQ  D:  03/03/2004  T:  03/04/2004  Job:  [id number]   cc:   Bunnie Pion, P.A.-C.  [facility]  Jonita Albee, Dustin   Donzetta Sprung  [address], [address]  [place]  [place] [address]  Fax: [phone]   [facility]  Fax: [phone]

## 2010-08-15 NOTE — Discharge Summary (Signed)
NAMEDANYL, DEEMS               ACCOUNT NO.:  [id number]   MEDICAL RECORD NO.:  [id number]          PATIENT TYPE:  INP   LOCATION:  2007                         FACILITY:  [facility]   PHYSICIAN:  Sheliah Plane, MD    DATE OF BIRTH:  [birth year]   DATE OF ADMISSION:  08/03/2005  DATE OF DISCHARGE:  08/06/2005                                 DISCHARGE SUMMARY   ADDENDUM TO THE DISCHARGE SUMMARY   As anticipated Mr. Nuzum was discharged home the afternoon of Aug 06, 2005.  The following reflects updated in his discharge medication regimen.  1.  Aspirin 81 mg p.o. daily.  2.  Lopressor 25 mg 1/2 tablet p.o. b.i.d.  3.  Altace 2.5 mg p.o. daily.  4.  Vytorin 10/40 mg p.o. daily.  5.  Metformin 500 mg p.o. b.i.d.  6.  Plavix 75 mg 1 p.o. daily.  7.  Nexium 40 mg p.o. daily or per his home regimen.  8.  Ultram 50 mg 1-2 tablets p.o. q.6 h. p.r.n. pain  9.  He was given a prescription for __________ as well to assist with      smoking cessation.   ALL ADDITIONAL DISCHARGE INSTRUCTIONS:  As previously dictated.      Jerold Coombe, P.A.      Sheliah Plane, MD  Electronically Signed    AWZ/MEDQ  D:  08/06/2005  T:  08/07/2005  Job:  [id number]   cc:   Sheliah Plane, MD  [address]  [place]  [place] [address]   [facility] Office   Suszanne Conners. Duran, P.A.  [address]  [place]  [place] [address]   Bunnie Pion

## 2010-08-15 NOTE — Discharge Summary (Signed)
NAMEPEIRCE, Benjamin [name]               ACCOUNT NO.:  [id number]   MEDICAL RECORD NO.:  [id number]          PATIENT TYPE:  INP   LOCATION:  2007                         FACILITY:  [facility]   PHYSICIAN:  Sheliah Plane, MD    DATE OF BIRTH:  [birth year]   DATE OF ADMISSION:  08/03/2005  DATE OF DISCHARGE:  08/05/2005                                 DISCHARGE SUMMARY   HISTORY OF PRESENT ILLNESS:  The patient is a 60 year old male with known  history of coronary artery disease having had a previous Taxus stent placed  in the LAD in September of 2004.  In October of 2005, he had a negative  stress test.  Recently, he has had vague complaints of arm discomfort and  right elbow pain thought to be tendinitis, but because he had not had his  stress test since 2002 a Cardiolite stress test was performed on July 17, 2005, and this showed ST changes with evidence of anterior ischemia.  He was  placed on IMDUR.  A cardiac catheterization was then performed and carried  out on July 22, 2005, by Dr. Eden Emms.  He was found to have severe LAD  disease.  He was referred to Dr. Tyrone Sage for surgical opinion.  He was  recommended to proceed with surgical revascularization and he was admitted  this hospitalization for the procedure.   ALLERGIES:  No known drug allergies.   MEDICATIONS:  1.  Nexium.  2.  Altace.  3.  Isosorbide.  4.  Vytorin 10/20.  5.  Metformin 500 mg twice a day.  6.  Aspirin 325 mg daily.  7.  Lopressor 12.5 mg b.i.d.   FAMILY HISTORY:   SOCIAL HISTORY:   REVIEW OF SYSTEMS:   PHYSICAL EXAMINATION:  Please see the history and physical done at the time  of admission.  Note, the patient does have extensive tobacco abuse history  having smoked three packs a day for 40 years, currently down to one pack per  day.  Also he is noted to drink heavily, although, has decreased  significantly recently.   HOSPITAL COURSE:  The patient was admitted electively and taken to the  operating  room on Aug 03, 2005, at which time, he underwent the following  procedure; off-pump coronary artery bypass grafting x1 with a left internal  mammary artery to the first diagonal.  It is noted that the LAD proper was  small and intramyocardial.  There were no lesions noted between the diagonal  and the LAD.  The patient tolerated the procedure well and was taken to the  surgical intensive care unit in stable condition.   Postoperatively, the patient has done well.  He has maintained stable  hemodynamics.  All routine lines, monitors, and drainage devices were  discontinued in the standard fashion.   Postoperative laboratory values are stable.  Hemoglobin and hematocrit on  postoperative day #2 is 13 and 40, respectively.  Electrolytes, BUN and  creatinine are all within normal limits.  Capillary blood glucoses are under  adequate control.  He has been restarted on his Metformin.  His  overall  status is felt to be tentatively stable for discharge in the morning of Aug 06, 2005, pending morning round reevaluation.   DISCHARGE INSTRUCTIONS:  The patient received written instructions regarding  medications, activity, diet, wound care, and follow-up.   FOLLOW UP:  Dr. Tyrone Sage on Aug 20, 2005, at 12 noon.  Dr. Eden Emms will see  the patient in 2 weeks.  He is instructed to call to arrange this  appointment.   DISCHARGE MEDICATIONS:  1.  Aspirin 325 mg daily.  2.  Toprol XL 25 mg daily.  3.  Altace 2.5 mg daily.  4.  Vytorin 10/40 mg daily.  5.  He is to resume his preoperative Nexium.  6.  He is to resume his preoperative Metformin 500 mg twice daily.  7.  For pain, Ultram 1-2 every 6 hours as needed.   ACTIVITY:  He is instructed not to drive and no lifting more than 10 pounds.  He is to continue his walking and breathing exercises as instructed.   DISCHARGE DIAGNOSES:  1.  Severe left anterior descending coronary artery disease now status post      surgical revascularization as  described.  2.  Other diagnoses as previously listed per the history.   CONDITION ON DISCHARGE:  Stable and improved.      Rowe Clack, P.A.-C.      Sheliah Plane, MD  Electronically Signed    WEG/MEDQ  D:  08/05/2005  T:  08/06/2005  Job:  [id number]   cc:   Charlton Haws, M.D.  [address]  [address]  [place]  [place] [address]   Suszanne Conners. Duran, P.A.  [address]  [place]  [place] [address]   Bunnie Pion, M.D.

## 2010-08-15 NOTE — Op Note (Signed)
[name], [name]               ACCOUNT NO.:  [id number]   MEDICAL RECORD NO.:  [id number]          PATIENT TYPE:  AMB   LOCATION:  DAY                           FACILITY:  [facility]   PHYSICIAN:  Lionel December, M.D.    DATE OF BIRTH:  03-19-1951   DATE OF PROCEDURE:  03/14/2004  DATE OF DISCHARGE:                                 OPERATIVE REPORT   PROCEDURE:  Esophagogastroduodenoscopy.   INDICATIONS:  Benjamin [name] is a 60 year old Caucasian male with chronic GERD  complicated by short-segment Barrett's esophagus.  He had EGD with biopsy in  April of this year.  Low-grade dysplasia.  He has been maintained on double-  dose PPI now and is asymptomatic.  He is undergoing repeat biopsies to make  sure that dysplasia is not progressive.  The procedure risks were reviewed  with the patient.  Informed consent was obtained.   PREMEDICATION:  Cetacaine spray for pharyngeal topical anesthesia, Demerol  50 mg IV, Versed 5 mg IV.   FINDINGS:  Procedure performed in endoscopy suite.  The patient's vital  signs and O2 saturation were monitored during procedure and remained stable.  The patient was placed in the left lateral recumbent position and the  Olympus video scope was passed via oropharynx without any difficulty into  esophagus.   Esophagus:  Mucosa of the esophagus normal until distally, where there was a  large patch of Barrett's-type mucosa with irregular borders measuring about  2 x 2 cm.  There was a much smaller patch across another site.  The proximal  margin was at 39, the GE junction was at 41, and the hiatus was at 43 cm  from the incisors.  No erosions or ulcers were noted.  A biopsy was taken  from this patch on the way out.   Stomach:  It was empty and distended very well with insufflation.  The folds  of the proximal stomach were normal.  Examination of the mucosa at body,  antrum, pyloric channel, as well as angularis, fundus, and cardia, was  normal.   Duodenum:  Bulbar  mucosa was normal.  The scope was passed to the second  part of the duodenum, where mucosa and folds were normal.  The endoscope was  pulled back in the esophagus.   Multiple biopsies were taken from these two patches of Barrett's mucosa and  the endoscope was withdrawn.  The patient tolerated the procedure well.   FINAL DIAGNOSES:  1.  Short-segment Barrett's esophagus.  Endoscopically appears to be stable.      Biopsies taken for routine histology.  2.  Small sliding hiatal hernia.   RECOMMENDATIONS:  He will continue antireflux measures and Nexium 40 mg  b.i.d. for now.     Naje   NR/MEDQ  D:  03/14/2004  T:  03/14/2004  Job:  [id number]   cc:   Donzetta Sprung  [address], [address]  [place]  [place] [address]  Fax: [phone]

## 2010-08-15 NOTE — H&P (Signed)
Benjamin [name], Benjamin [name]               ACCOUNT NO.:  [id number]   MEDICAL RECORD NO.:  [id number]          PATIENT TYPE:  INP   LOCATION:  NA                           FACILITY:  [facility]   PHYSICIAN:  Quita Skye. Hart Rochester, M.D.  DATE OF BIRTH:  10/01/1950   DATE OF ADMISSION:  07/30/2004  DATE OF DISCHARGE:                                HISTORY & PHYSICAL   CHIEF COMPLAINT:  Severe right carotid occlusive disease with recent right  brain TIA.   HISTORY OF PRESENT ILLNESS:  This 59 year old white male patient has a  history of significant coronary artery disease.  He underwent a TAXUS stent  insertion to the left anterior descending artery on December 06, 2002 and  had a good result, and was maintained on Plavix for 12 months.  During  followup this year, he was found to have a left carotid bruit and a carotid  duplex exam was performed which revealed a severe right internal carotid  stenosis and a moderate left internal carotid stenosis.  Subsequent to that  study which was performed on July 23, 2004, the patient suffered a right  brain TIA consisting of transient left upper extremity weakness, clumsiness  with a drawing sensation.  This resolved after 2-3 minutes.  He was  referred for further evaluation and a repeat carotid duplex exam at the CVTS  office confirmed the above findings with a 95-99% right internal carotid  stenosis and he was scheduled for surgical repair, after discussion with Dr.  Jerral Bonito.   PAST MEDICAL HISTORY:  1.  Diabetes, borderline.  2.  Hypertension.  3.  Hyperlipidemia.  4.  Coronary artery disease.  5.  Moderate chronic obstructive pulmonary disease.  6.  History of gastroesophageal reflux disease.  7.  Previous history of ethanol abuse.   PREVIOUS SURGERY:  1.  Right inguinal hernia repair.  2.  Tonsillectomy.   He denies any history of previous stroke, deep vein thrombosis, or pulmonary  emboli.   FAMILY HISTORY:  Negative for coronary artery  disease, diabetes, and stroke.   SOCIAL HISTORY:  He is married.  He has three children and works at  [employer].  He smokes 2-3 packs of cigarettes per day.  He has done so for  40-45 years.  He drinks 3-4 beers per day.   REVIEW OF SYSTEMS:  Has had some recent weight gain.  On rare occasions will  have some chest tightness, has done so for the last 3-4 years.  Has dyspnea  on exertion on a regular basis.  Denies any PND or orthopnea.  PULMONARY:  He has an occasional productive cough and chronic bronchitis.  GI:  Has had  a history of blood in the stool in the past but not recently and does have  symptoms of reflux esophagitis.  VASCULAR:  Denies any lower extremity  claudication type symptoms.   MEDICATIONS:  1.  Altace 2.5 mg one per day.  2.  Nexium 40 mg two per day.  3.  Vytorin 10-20 mg one per day.  4.  Aspirin 325 mg one per day.  5.  Astelin [employer] 137 mcg two times daily.   ALLERGIES:  None known.   PHYSICAL EXAMINATION:  VITAL SIGNS:  Blood pressure 124/70, heart rate 76,  respirations are 18.  GENERAL:  He is a healthy-appearing middle-aged male who is no apparent  distress.  He is alert and oriented  x 3.  NECK:  Supple with 3+ carotid pulses and a soft bruit on the left.  There is  no palpable adenopathy in the neck.  NEUROLOGIC:  Normal.  UPPER EXTREMITIES:  Reveals 3+ brachial and radial pulses bilaterally.  Well  perfused hands.  CHEST:  Clear to auscultation.  CARDIOVASCULAR:  Reveals a regular rhythm with no murmurs.  ABDOMEN:  Soft, nontender with no masses palpable.  LOWER EXTREMITIES:  Reveals 3+ femoral, 2+ popliteal, and 2+ posterior  tibial pulses palpable bilaterally.   IMPRESSION:  1.  Greater than 95% right internal carotid stenosis with a recent right      brain transient ischemic attack.  2.  Coronary artery disease, status post percutaneous transluminal coronary      angioplasty and stenting of the left anterior descending artery, in       September 2004, with a TAXUS stent.  3.  Chronic obstructive pulmonary disease.  4.  Hypertension.  5.  Hyperlipidemia.  6.  History of gastroesophageal reflux.   PLAN:  Admit the patient, on Jul 31, 2003, for an elective right carotid  endarterectomy for relief of symptoms and prevention of stroke.  Risks were  thoroughly discussed with the patient who would like to proceed.      JDL/MEDQ  D:  07/29/2004  T:  07/29/2004  Job:  [id number]   cc:   Willa Rough, M.D.   Bunnie Pion, Dr.  Morrie Sheldon Berneda Rose, [place]   Roxanne Mins, Mr.

## 2010-08-15 NOTE — Discharge Summary (Signed)
NAME:  Benjamin [name], Benjamin [name]                         ACCOUNT NO.:  [id number]   MEDICAL RECORD NO.:  [id number]                   PATIENT TYPE:  OIB   LOCATION:  6522                                 FACILITY:  [facility]   PHYSICIAN:  Learta Codding, M.D.                 DATE OF BIRTH:  1950/09/03   DATE OF ADMISSION:  12/06/2002  DATE OF DISCHARGE:  12/07/2002                           DISCHARGE SUMMARY - REFERRING   PROCEDURE:  Coronary artery stenting on September 8.   REASON FOR ADMISSION:  Please refer to dictated note.   LABORATORY DATA:  Normal CBC, electrolytes, and renal function at discharge.  Normal CPK/MB (post PCI).   HOSPITAL COURSE:  The patient presented for elective coronary angiography,  following recent abnormal stress test suggestive of anterior ischemia, in  the context of multiple cardiac risk factors.   Cardiac catheterization, performed by Salvadore Farber, M.D. (see report  for full details), reveals significant symptoms of coronary artery disease  with a normal left ventricle.   Dr. Samule Ohm proceeded with successful stenting (TAXUS) of a 95% ostial LAD  lesion, with no noted complications.   The patient was kept for overnight observation, cleared for discharge the  following morning, in hemodynamically stable condition.  There was no noted  complications of the right groin.   The patient does have history of tobacco smoking and will be seen by smoking  cessation team prior to discharge.   Medication adjustments this admission:  Treatment with Plavix for one year,  additional Lipitor, Wellbutrin, and substitution of Altace for  hydrochlorothiazide.   DISCHARGE MEDICATIONS:  1. Plavix 75 mg daily (x1 year).  2. Enteric-coated aspirin 325 mg daily.  3. Lipitor 20 mg daily.  4. Altace 2.5 mg daily.  5. Nexium 40 mg daily.  6. Wellbutrin 150 mg daily (x3 days) then b.i.d.  7. Nitrostat 0.4 mg p.r.n.   DISCHARGE INSTRUCTIONS:  1. Stop hydrochlorothiazide.  2. No heavy lifting/driving x2 days. The patient is to not return to work     until Monday, September 20.  3. Low fat/cholesterol diet.  4. Stop smoking tobacco.  5. Call the office if there is any swelling/bleeding of the groin.   The patient will need a follow-up BMET in one week for monitoring of  hypokalemia/renal function on Altace.  He will be referred for an exercise  stress Cardiolite to be scheduled in approximately one month, at [facility]. He will then follow up with Dr. Andee Lineman, in [place], for review of  these results and further management.   DISCHARGE DIAGNOSES:  1. Single vessel coronary artery disease.     A. Status post stent (TAXUS) 95% ostial left anterior descending -        September 8.     B. Recent abnormal stress Cardiolite.     C. Normal left ventricular function.     D. Status post recent  syncope.  2. Dyslipidemia.  3. History of hypertension.  4. Tobacco abuse.  5. History of gastroesophageal reflux disease.      Gene Serpe, P.A. [facility]                      Learta Codding, M.D.    GS/MEDQ  D:  12/07/2002  T:  12/07/2002  Job:  [id number]   cc:   Fara Chute  [address] [place]  [place] [address]  Fax: [phone]   [facility], [place]

## 2010-08-15 NOTE — Op Note (Signed)
NAME:  Benjamin, [name]                         ACCOUNT NO.:  [id number]   MEDICAL RECORD NO.:  [id number]                   PATIENT TYPE:  AMB   LOCATION:  DAY                                  FACILITY:  [facility]   PHYSICIAN:  Benjamin [name], M.D.                 DATE OF BIRTH:  1950-06-27   DATE OF PROCEDURE:  07/25/2003  DATE OF DISCHARGE:                                 OPERATIVE REPORT   PROCEDURE:  Esophagogastroduodenoscopy followed by total colonoscopy.   INDICATIONS FOR PROCEDURE:  Benjamin [name] is a 60 year old Caucasian male who has  known Barrett's esophagus.  His symptoms are well-controlled with therapy.  He is undergoing a surveillance EGD.  He also has history of colonic polyps.  He had an episode of hematochezia about 12 weeks ago.  His colonoscopy was a  little over a year ago.  He had two polyps removed, both of which were  tubular adenomas.  One was right at the appendiceal orifice.  It is felt  that this area needs to be looked at to make sure that there is no residual  polyp left.  The procedure risks were reviewed with the patient, and  informed consent for the procedure was obtained.   PREOPERATIVE MEDICATIONS:  Cetacaine spray for pharyngeal topical  anesthesia, Demerol 50 mg IV, Versed 6 mg IV in divided dose.   FINDINGS:  The procedures were performed in the endoscopy suite.  The  patient's vital signs and O2 saturations were monitored during the procedure  and remained stable.   PROCEDURE #1, ESOPHAGOGASTRODUODENOSCOPY:  The patient was placed in the  left lateral recumbent position, and the Olympus videoscope was passed via  the oropharynx without any difficulty into the esophagus.   Esophagus:  The mucosa of the proximal and middle segment was normal.  Distally, there was a large patch of Barrett's mucosa measuring about 2 x 2  cm.  It had a island of squamous epithelium towards the periphery on the  right side just like it was on the last exam.  The proximal  margin of  Barrett's was at 39.  The GE junction was 41 cm.  He had a small sliding  hiatal hernia no more than 2 cm in length.  Biopsies were taken from this  patch of Barrett's mucosa on the way out.   Stomach:  It was empty and distended very well with insufflation.  The folds  of the proximal stomach were normal.  Examination of the mucosa at the body,  antrum, pyloric channel, as well as angularis, fundus, and cardia were  normal.   Duodenum:  Examination of the bulb and postbulbar duodenum was normal.  The  endoscope was withdrawn and the patient prepared for procedure #2.   PROCEDURE #2, TOTAL COLONOSCOPY:  Rectal examination was performed.  No  abnormality noted on external or digital exam.  The Olympus videoscope was  placed into  the rectum and advanced into the region of the sigmoid colon and  beyond.  The preparation was satisfactory.  The scope was passed to the  cecum which was identified by the appendiceal orifice and ileocecal valve.  The appendiceal orifice was well-examined, and there was no residual polyp.  There was a small scar below it.  As the scope was withdrawn, the colonic  mucosa was once again carefully examined and was normal throughout.  The  rectal mucosa similarly was normal.  The scope was retroflexed to examine  the anorectal junction, and hemorrhoids were noted below the dentate line  along with one hemorrhoid above the dentate line.  The endoscope was  straightened and withdrawn.  The patient tolerated the procedure well.   FINAL DIAGNOSES:  1. Short-segment Barrett's esophagus which is stable.  Biopsies taken     looking for dysplasia.  2. Small sliding hiatal hernia.  3. Internal and external hemorrhoids felt to be the source of his recent     rectal bleeding.  4. No evidence of residual polyp at cecum.   RECOMMENDATIONS:  1. He will continue antireflux measures and Nexium as before.  2. I will be contacting the patient with the biopsy results  and further     recommendations.  3. Will consider repeat EGD in three years and perhaps a colonoscopy in five     years from now.      ___________________________________________                                            Benjamin [name], M.D.   NR/MEDQ  D:  07/25/2003  T:  07/25/2003  Job:  [id number]   cc:   Benjamin [name]  [address]  [place]  [place] [address]  Fax: [phone]

## 2010-08-15 NOTE — H&P (Signed)
NAME:  [name], Benjamin C.                        ACCOUNT NO.:  [id number]   MEDICAL RECORD NO.:  [id number]                   PATIENT TYPE:  OIB   LOCATION:                                       FACILITY:  [facility]   PHYSICIAN:  Learta Codding, M.D.                 DATE OF BIRTH:  18-May-1950   DATE OF ADMISSION:  12/06/2002  DATE OF DISCHARGE:                                HISTORY & PHYSICAL   ANTICIPATED DATE OF ADMISSION:  He will be admitted for heart  catheterization December 06, 2002 electively from our office.   CHIEF COMPLAINT:  Syncope.   HISTORY OF PRESENT ILLNESS:  Mr. Vos was seen by Rollene Rotunda, M.D.  November 24, 2002 for evaluation of syncope.  He had been working hard cutting  lawns.  He had drank six beers.  He then had a syncopal episode.  Because of  this, it was felt that it might be multifactorial related to the fact that  he had been out in the hot sun and drinking beer and had just had a large  meal.  However, he does have some shortness of breath, has hypertension,  tobacco.  It was felt that further workup was needed.  The patient underwent  a Cardiolite study November 29, 2002.  Clinically was negative for ischemia.  Electrocardiography was positive for ischemia.  Perfusion data was positive  for ischemia in the distribution of the left anterior descending artery.  Because of this, he was asked to come into the office today to be set up for  heart catheterization.  It should be noted that back in 2001, he had a  negative nuclear study at that time.   PAST MEDICAL HISTORY:  Positive for hematemesis last year which was  evaluated.  He had reflux.  He has ongoing tobacco use, hyperlipidemia,  hypertension, a laryngeal polyp.  History of hernia repair in 1998, sinus  surgery in 1999.  Occasional palpitations.   ALLERGIES:  No known drug allergies.   MEDICATIONS:  1. Enteric-coated aspirin.  2. Hydrochlorothiazide 25 mg daily.  3. Nexium 40 mg daily.   SOCIAL HISTORY:  He is married.  He has three children.  He works at  [employer].  He has been smoking up to three packs a day for 40 years but he  has now cut down to one pack per day.  He does drink at least three beers or  four beers every day.   FAMILY HISTORY:  Noncontributory for early coronary disease.   REVIEW OF SYSTEMS:  As per HPI.   PHYSICAL EXAMINATION:  VITAL SIGNS:  Blood pressure is 104/70, weight is  154, heart rate is 80.  GENERAL:  This is a pleasant bearded white male in no acute distress.  NECK:  Does not show any JVD or carotid bruits.  LUNGS:  Decreased breath sounds bilaterally but no wheezing, rales,  or  rhonchi.  CARDIAC:  Heart sounds are regular.  Normal S1 & S2 without any murmurs,  rubs, or clicks.  ABDOMEN:  Flat, soft, nontender.  Bowel sounds are present.  EXTREMITIES:  Pedal pulses are intact and no edema.   DIAGNOSTIC DATA:  EKG showed normal sinus rhythm, left axis deviation, left  anterior vesicular block with no acute ST-T wave changes.   IMPRESSION:  1. Recent syncopal episode with positive nuclear study for left anterior     ischemia.  2. Shortness of breath and decreased exercise tolerance possibly related to     above and/or ongoing tobacco abuse.  3. Tobacco abuse and probable chronic obstructive pulmonary disease, advised     cessation of tobacco.  4. Hypertension.  5. Gastroesophageal reflux disease.  6. Alcohol abuse.  7. Hyperlipidemia.  8. Dental caries.   PLAN:  The patient will be admitted electively for heart catheterization  December 06, 2002.  The patient agrees, understands, and accepts the risks  and benefits of the explained procedure.      Suszanne Conners. Julious Oka.                    Learta Codding, M.D.    MRD/MEDQ  D:  11/30/2002  T:  11/30/2002  Job:  [id number]   cc:   Rogue Jury, P.A.  [facility]O [facility]  [address] [place]  [place] [address]  Fax: [phone]

## 2010-08-15 NOTE — Discharge Summary (Signed)
Benjamin [name], Benjamin [name]               ACCOUNT NO.:  [id number]   MEDICAL RECORD NO.:  [id number]          PATIENT TYPE:  INP   LOCATION:  3311                         FACILITY:  [facility]   PHYSICIAN:  Quita Skye. Hart Rochester, M.D.  DATE OF BIRTH:  11/29/1950   DATE OF ADMISSION:  07/30/2004  DATE OF DISCHARGE:  07/31/2004                                 DISCHARGE SUMMARY   ADMISSION DIAGNOSIS:  Severe right internal carotid artery stenosis with  right hemispheric transient ischemic attack.   DISCHARGE DIAGNOSIS:  1.  Severe right internal carotid artery stenosis with right hemispheric      transient ischemic attack.  2.  Diabetes, borderline.  3.  Hyperlipidemia.  4.  Hypertension.  5.  Coronary artery disease.  6.  Moderate chronic obstructive pulmonary disease.  7.  Gastroesophageal reflux disease.  8.  Previous history of ethanol abuse.  9.  History of right inguinal hernia repair in 1998.  10. History of tonsillectomy.   ALLERGIES:  No known drug allergies.   PROCEDURE:  Jul 30, 2004, right carotid endarterectomy with Dacron patch  angioplasty by Dr. Josephina Gip.   BRIEF HISTORY:  Benjamin [name] is a 60 year old Caucasian male who has a history  of significant coronary artery disease.  He underwent a Taxus stent  insertion in the left anterior descending artery on December 06, 2002, and  had a good result and was maintained on Plavix for 12 months.  During follow  up this year, he was found to have a left carotid bruit and a carotid duplex  scan was performed which revealed a severe right internal carotid artery  stenosis and moderate left internal carotid artery stenosis.  Subsequent to  that study which was performed on July 23, 2004, the patient suffered a  right brain TIA consisting of transient left upper extremity weakness and  clumsiness with a drawing sensation.  This resolved after 2-3 minutes.  He  was referred for further evaluation and a repeat carotid duplex exam in the  CVTS office confirmed the above findings with a 95-99% right internal  carotid artery stenosis.  After obtaining cardiac clearance by Dr. Willa Rough, Benjamin [name] was scheduled for elective carotid endarterectomy to  decrease his risks for stroke.   HOSPITAL COURSE:  On Jul 30, 2004, Benjamin [name] was electively admitted to  [facility] and did undergo a right carotid endarterectomy.  Overall, he was felt to tolerate the procedure well and was extubated  neurologically intact.  After a short stay in the recovery unit, he was  transferred to unit 3300 as a step down unit for further management.  The  afternoon following his surgery, he did have a tobacco cessation consult to  reinforce the importance of continuing with his nicotine patch.  On postop  day one, Benjamin [name] remained hemodynamically stable overnight.  He was  afebrile with blood pressure ranging 135/70, heart rate in the 70s and 80s  and in sinus rhythm.  During morning rounds, he was saturating 100% on 2  liters per nasal cannula which was subsequently weaned.  He  was voiding  without difficulty.  He did have one episode of nausea and vomiting the  previous night but was able to tolerate breakfast without any further GI  symptoms.  His postoperative labs were stable with a white blood cell count  9.6, hemoglobin 13.5, hematocrit 39.5, platelet count 272.  Sodium 133,  potassium 3.6, BUN 5, creatinine 0.8, and blood glucose 151.  Subjectively,  he reported some mild incisional pain with associated headache, both which  were resolved with pain medications.  He denied dysphagia.  On exam, his  heart had a regular rate and rhythm.  His lungs were clear.  His abdomen was  soft, mildly distended but with good bowel sounds.  He was neurologically  intact.  His tongue was midline.  His right neck incision was clean, dry,  and intact without evidence of hematoma.  It was felt that once he was able  to ambulate in the hallways that  he would be ready for discharge home.  He  met this criteria shortly after breakfast and was discharged home on Jul 31, 2004, in stable condition.   DISCHARGE MEDICATIONS:  1.  Altace 2.5 mg one daily.  2.  Nexium 40 mg one p.o. b.i.d.  3.  Vytorin 10-20 mg one daily.  4.  Aspirin 325 mg p.o. daily.  5.  Nicotine patch 21 mg daily.  6.  Tylox 1-2 tablets  p.o. q.4-6h. p.r.n. pain.   DISCHARGE INSTRUCTIONS:  He is instructed to avoid driving, heavy lifting,  or strenuous activities for two weeks.  He is to follow a low saturated fat  balanced diet.  He may shower starting Aug 01, 2004.  He is to call if he  develops fever, redness, or drainage from the incision site.  He is to  follow up with Dr. Josephina Gip at the CVTS office on Tuesday, May 23, at  10:50 a.m.      AWZ/MEDQ  D:  07/31/2004  T:  07/31/2004  Job:  [id number]   cc:   Willa Rough, M.D.   Suszanne Conners. Julious Oka.   Donzetta Sprung  [address], [address]  [place]  [place] [address]  Fax: [phone]

## 2010-08-15 NOTE — H&P (Signed)
[facility]  Patient:    Benjamin [name], Benjamin [name] Visit Number: [id number] MRN: [id number]          Service Type: END Location: DAY Attending Physician:  Malissa Hippo Dictated by:   Lionel December, M.D. Admit Date:  06/21/2001 Discharge Date: 06/21/2001   CC:         Rogue Jury, P.A.C./Terry Reuel Boom, M.D., [place], [facility]   History and Physical  PRESENTING COMPLAINT:  Hematemesis.  HISTORY OF PRESENT ILLNESS:  The patient is a 60 year old Caucasian male known to our practice whose last visit was six months ago, now presents with history of hematemesis.  On June 11, 2001, he coughed up a small amount of fresh blood a couple of times, and then all of a sudden he vomited a large amount of coffee-grounds material and fresh blood.  He did not experience diaphoresis or lightheadedness.  One day earlier he had noted some chest pain.  He went over to [facility] ER.  He was seen by Dr. Bradly Bienenstock.  His WBC was 8.4, H&H was 17.8 and 51.7, platelet count was 286,000.  His INR was 1.1 and PTT was 26.9.  His BUN was 7, creatinine 0.9.  His AST was 46, ALT 48, albumin 4.1, and bilirubin 0.6.  His acute abdominal series was unremarkable.  There was a 5 mm nodular density on the right, thought to be a nipple shadow, but follow-up films are advised with nipple markers.  I am not sure whether these were done or not. He did notice some tarry stools over the weekend.  He has not had any more hematemesis.  He did experience some pain across his upper abdomen.  He states that he has been drinking too much beer lately.  Intake increased about a year ago when he lost his mother in April 2002.  However, he has not had any alcohol in two days.  He states his heartburn is well controlled as long as he watches his diet and stays on his Nexium.  When we saw him in September of last year, we asked him to stay on b.i.d. for a few weeks.  Now he is back on 40 mg q.a.m.  He tells me that  one month ago he had an episode of chest pain at work.  EKG x 2 was normal.  He has not had this recur.  He has a good appetite.  He has not lost any weight recently.  He also denies hoarseness, chronic cough, or dysphagia.  In addition to Nexium 40 mg q.a.m., he is on Zantac 150 mg q.h.s. and aspirin 325 mg q.d. p.r.n.  PAST MEDICAL HISTORY: 1. Chronic GERD. 2. Benign polyps or nodules removed from his vocal cords by Dr. Haroldine Laws. 3. EGD in March 2002, which showed short-segment Barretts esophagus and a    small sliding hiatal hernia.  Biopsy from the distal esophagus was    consistent with Barretts esophagus, but no dysplasia was noted. 4. History of colonic polyps.  He had an exam by Dr. Gabriel Cirri in March 2001,    with removal of tubular adenoma from the sigmoid colon.  He also had    sigmoid colon diverticula.  He will not be due for another colonoscopy    until March 2004. 5. Hypercholesterolemia, presently on no treatment.  ALLERGIES:  No known allergies.  FAMILY HISTORY:  Negative for colon carcinoma.  SOCIAL HISTORY:  He is married and has three children.  He works at [employer].  He is still smoking but has cut down to one to two packs per day, and he has not had any alcohol in the last two to three days, and prior to that he had been drinking anywhere from one to three or more cans per day.  PHYSICAL EXAMINATION:  GENERAL:  Pleasant, well-developed, well-nourished Caucasian male who is in no acute distress.  VITAL SIGNS:  Weight 159 pounds, height 5 feet 9 inches.  Pulse 78 per minute, blood pressure 120/80, afebrile.  HEENT:  Conjunctivae pink, sclerae nonicteric.  Oropharyngeal mucosa is normal.  Some of the teeth are missing, particularly in the upper jaw.  He has partial plate but does not use it because it does not fit well and makes him nauseated.  NECK:  Without masses or thyromegaly.  BREASTS:  He does not have gynecomastia or spider angiomata.  CARDIAC:   Regular rhythm.  Normal S1 and S2.  LUNGS:  Clear to auscultation.  ABDOMEN:  Symmetrical and soft.  Liver edge is palpable below the right costal margin.  It is smooth and nontender.  Spleen is not palpable.  RECTAL:  Brown guaiac-negative stool.  EXTREMITIES:  No clubbing or edema.  LABORATORY DATA:  As above.  ASSESSMENT:  The patient is a 60 year old Caucasian male with known chronic gastroesophageal reflux disease complicated by short-segment Barretts esophagus whose symptoms have been fairly well controlled with antireflux measures and single dose of Nexium.  Now presents with hematemesis.  His H&H was normal.  Clinically it does not appear that he has continuing to lose blood from his upper GI tract.  He could have a Mallory-Weiss tear, peptic ulcer disease, or alcoholic gastritis.  Mildly elevated transaminases, ALT is slightly more than AST.  The patient is not at high risk for viral hepatitis.  PLAN:  I have asked him to discontinue his Zantac but increase Nexium to 40 mg b.i.d., samples given to supplement his own prescription. Esophagogastroduodenoscopy to be performed at [facility] in the near future.  I have reviewed the procedure and risks with the patient, and he is agreeable.  Once again, he is strongly advised to quit drinking alcohol altogether.  He feels very motivated, and he feels he can do it without any help.  I would plan to repeat his LFTs in four weeks and, if they are still elevated, further workup would be undertaken.  I would plan to check his serum ferritin with the next blood draw. Dictated by:   Lionel December, M.D. Attending Physician:  Malissa Hippo DD:  06/20/01 TD:  06/20/01 Job: [id number]

## 2010-08-15 NOTE — Op Note (Signed)
NAMEANTWAN, [name]               ACCOUNT NO.:  [id number]   MEDICAL RECORD NO.:  [id number]          PATIENT TYPE:  INP   LOCATION:  2309                         FACILITY:  [facility]   PHYSICIAN:  Sheliah Plane, MD    DATE OF BIRTH:  [birth year]   DATE OF PROCEDURE:  08/03/2005  DATE OF DISCHARGE:                                 OPERATIVE REPORT   PREOPERATIVE DIAGNOSIS:  Coronary occlusive disease.   POSTOPERATIVE DIAGNOSIS:  Coronary occlusive disease.   SURGICAL PROCEDURE:  Off-pump coronary artery bypass grafting x 1 with left  internal mammary to the diagonal coronary artery (LAD small and  intramyocardial).   SURGEON:  Sheliah Plane, M.D.   FIRST ASSISTANT:  Jerold Coombe, P.A.   BRIEF HISTORY:  The patient is a 60 year old male who had previously had a  stent placed in his very proximal LAD.  He had restenosis of the stent both  proximal to the stent and distal to stent with recurrent episodic anginal  symptoms.  Dr. Charlton Haws performed outpatient cardiac catheterization,  confirming this.  The patient is a heavy smoker.  His right coronary artery  was patent without disease as was the circumflex.  Bypass grafting to the  anterior myocardial system was recommended to the patient who agreed and  signed informed consent.   DESCRIPTION OF PROCEDURE:  With Swan-Ganz and arterial line monitors in  place, the patient underwent general endotracheal anesthesia without  incidence.  The skin of the chest wall was prepped with Betadine and draped  in the usual sterile manner.  A median sternotomy was performed.  The left  internal mammary artery was dissected down to its pedicle.  The distal  artery was divided and had good free flow.  The pericardium was opened.  The  anterior wall of the heart was examined.  The very distal LAD was extremely  small and the course of the LAD was intramyocardial.  The first diagonal was  readily obvious.  Even at this point, the LAD was  intramyocardial.  There  was no lesion between the first diagonal and the distal LAD, and they were  approximately of the same size.  At the level of the second diagonal, the  LAD was dissected out of its intramyocardial position and was very small.  It was felt that to obtain the best anastomosis with adequate-sized vessel  was to place the mammary artery to the first diagonal which was as large as  the LAD.  Vessel tapes were placed around the diagonal artery.  The patient  was systemically heparinized using the Guidant off-pump stabilization  retractor system.  The first diagonal was easily isolated and stabilized  with good visualization.  The vessel was opened and admitted a 1.5 mm probe.  Using a running 8-0 Prolene, the left internal mammary artery was  anastomosed to the first diagonal coronary artery.  Blood flow was returned  down the mammary artery.  The fascia was tacked to the epicardium.  The  retractors were removed.  The patient tolerated the procedure without  changes in blood pressure or EKG.  Atrial and ventricular pacing wires were  applied.  The pericardium was loosely reapproximated.  A left pleural tube  and a single mediastinal tube were left in place.  The sternum was closed  with a #6 __________ .  The fascia was closed with interrupted 0-Vicryl  running and 3-0 Vicryl in the subcutaneous tissue, 4-0 subcuticular stitch  in skin edges.  Dry  dressings were applied.  Sponge and needle count was reported as correct at  the completion of the procedure.  The patient tolerated the procedure  without obvious complication and was transferred to the surgical intensive  care unit for further postoperative care.      Sheliah Plane, MD  Electronically Signed     EG/MEDQ  D:  08/04/2005  T:  08/04/2005  Job:  [id number]   cc:   Charlton Haws, M.D.  [address]  [address]  [place]  [place] [address]

## 2010-08-15 NOTE — Cardiovascular Report (Signed)
NAMEKREGG, CIHLAR                         ACCOUNT NO.:  [id number]   MEDICAL RECORD NO.:  [id number]                   PATIENT TYPE:  OIB   LOCATION:  6522                                 FACILITY:  [facility]   PHYSICIAN:  Salvadore Farber, M.D.             DATE OF BIRTH:  [birth year]   DATE OF PROCEDURE:  12/06/2002  DATE OF DISCHARGE:                              CARDIAC CATHETERIZATION   PROCEDURES PERFORMED:  1. Left heart catheterization.  2. Left ventriculography.  3. Coronary angiography.  4. Stent to the left anterior descending.   CARDIOLOGIST:  Salvadore Farber, M.D.   INDICATIONS:  Mr. Kloepfer is a 60 year old gentleman who suffered syncope on  November 24, 2002 after working outside and drinking a substantial volume of  alcohol.  He does, however, note some shortness of breath with exertion.  He  therefore underwent Cardiolite study earlier this week, which was positive  electrocardiographically.  Perfusion images demonstrated ischemia in the  territory of the LAD.  This is new compared to the study of 2001.  He was  therefore referred for diagnostic angiography and possible percutaneous  coronary intervention.   PROCEDURAL TECHNIQUE:  Informed consent was obtained.  Under 1% lidocaine  local anesthesia a 6 French sheath was placed in the right femoral artery  using the modified Seldinger technique.  Diagnostic angiography and  ventriculography were performed using JL-4, JR-4 and pigtail catheters.  The  case then turned to intervention.   Anticoagulation was initiated with heparin and eptifibatide to achieve and  maintain an ACT of greater than 200 seconds.  Three-hundred milligrams of  oral Plavix was given.  A 6 [place] CLS-3.5 guide was advanced over a wire  and engaged in the ostium of the left main.  A Luge wire was advanced  without difficulty beyond the lesion into the distal LAD.  The lesion was  then predilated using a 2.5 x 9 mm Maverick balloon at six  atmospheres.  The  lesion was then stented using a 2.5 x 12 mm TAXUS stent at 14 atmospheres.  Intravascular ultrasound was then performed to confirm stent apposition.  This demonstrated incomplete expansion proximally; therefore, a 3.0 x 8 mm  PowerSail balloon was used to post dilate the entirety of the stent at 14  atmospheres.  Repeat IVUS again confirmed incomplete sent expansion  proximally concerning for inability for the stent to fully scaffold the  lesion.  The ostium of the lesion was again post dilated using the same  balloon at 16 atmospheres.  Repeat IVUS demonstrated excellent stent  apposition, though with somewhat incomplete expansion proximally.  The stent  did not quite reach the ostium of the LAD.  At the ostium of the LAD there  was approximately 40% residual stenosis.  By IVUS there was a minimal  luminal area of 4.5 mm squared above the threshold of 4.0 mm squared, which  would suggest the  possibility of residual ischemic burden.  Decision was  therefore made to manage conservatively.   COMPLICATIONS:  None.   FINDINGS:  1. Left Main:  Angiographically normal..   1. LAD:  Proximal 95% stenosis stented as above to 0% residual within the     lesion.  There remains an ostial 40% stenosis.  There is no disease in     the remainder of the vessel.   1. Circumflex:  Large vessel giving rise to three obtuse marginals.  It is     angiographically normal.   1. RCA:  Large, dominant vessel, which supplies modest collaterals to the     LAD territory.  There is a 30% proximal stenosis.   1. LV 125/4/13.  EF 60% without regional wall motion abnormality.   1. No aortic stenosis or mitral regurgitation.   IMPRESSION AND PLAN:  Successful stenting of the proximal left anterior  descending resulting in no residual stenosis at the original lesion site and  approximately a 40% lesion at the ostium.   Plan is continued planned medical therapy with Plavix for one year.   Aspirin  will be continued indefinitely.                                                 Salvadore Farber, M.D.    WED/MEDQ  D:  12/06/2002  T:  12/06/2002  Job:  [id number]   cc:   Fara Chute  [address] [place]  [place] [address]  Fax: [phone]   Amy Johny Sax.   Learta Codding, M.D.  [address]  [address]  [place]  [place] [address]

## 2010-08-15 NOTE — Op Note (Signed)
NAME:  Ramser, JONES VIVIANI                         ACCOUNT NO.:  [id number]   MEDICAL RECORD NO.:  [id number]                   PATIENT TYPE:  AMB   LOCATION:  DAY                                  FACILITY:  [facility]   PHYSICIAN:  Lionel December, M.D.                 DATE OF BIRTH:  Jan 07, 1951   DATE OF PROCEDURE:  DATE OF DISCHARGE:                                 OPERATIVE REPORT   PROCEDURE:  Total colonoscopy with polypectomy.   INDICATIONS FOR PROCEDURE:  Benjamin [name] is a 60 year old Caucasian male with a  history of colonic polyps whose last exam was about three years ago. He  recently presented with hematochezia. He is undergoing  diagnostic/surveillance colonoscopy. The procedure was reviewed with the  patient, informed consent was obtained.   PREOP MEDICATIONS:  Demerol 50 mg IV, Versed 5 mg IV in divided dose.   INSTRUMENT:  Olympus video system.   FINDINGS:  Procedure performed in endoscopy suite. The patient's vital signs  and O2 saturations were monitored during the procedure and remained stable.  The patient was placed in the left lateral decubitus position, rectal  examination performed. No abnormality noted on external or digital exam. The  scope was placed in the rectum and advanced under direct vision into the  sigmoid colon and beyond. Preparation was satisfactory. The scope was passed  through the cecum. There was a polyp right at the appendiceal orifice or  next to it. This was snared. Polypectomy was complete and a well documented  endoscopic pictures. There was another small polyp next to it which was  coagulated using snare tip. The ileocecal valve was normal. As the scope was  withdrawn, the colonic mucosa was once again carefully examined. Another 6-7  mm polyp was noted at the splenic flexure and snared. The polyp at the cecum  was about 7-8 mm. A few diverticula were noted at the sigmoid colon.  The  rectal mucosa was normal. The scope was retroflexed to examine  the anorectal  junction, hemorrhoids were noted above the dentate line. The endoscope was  straightened and withdrawn. The patient tolerated the procedure well.   FINAL DIAGNOSIS:  Two polyps snared, one from the cecum, another one from  the splenic flexure. The one in the cecum was about 8 mm. Small polyp at the  cecum was coagulated. Next, the sigmoid colon diverticula. Next, internal  hemorrhoids felt to be cause of his rectal bleeding.   RECOMMENDATIONS:  Standard instructions given. High fiber diet. Citrucel one  tablespoonful daily. I will contact the patient with biopsy results.                                                Lionel December, M.D.   NR/MEDQ  D:  07/12/2002  T:  07/12/2002  Job:  [id number]   cc:   Ms. Rogue Jury, P.A.C.

## 2010-12-18 LAB — BASIC METABOLIC PANEL
CO2: 25
Calcium: 8.2 — ABNORMAL LOW
GFR calc Af Amer: >60
Glucose, Bld: 123 — ABNORMAL HIGH
Potassium: 3.9

## 2010-12-18 LAB — CBC
MCV: 79.7
Platelets: 343
RBC: 5.65
RDW: 16.7 — ABNORMAL HIGH
WBC: 8.5
WBC: 9.3

## 2010-12-18 LAB — COMPREHENSIVE METABOLIC PANEL
ALT: 16
Albumin: 4.1
Alkaline Phosphatase: 51
BUN: 9
CO2: 27
Chloride: 97
Creatinine, Ser: 0.79
GFR calc non Af Amer: >60
Glucose, Bld: 111 — ABNORMAL HIGH
Sodium: 133 — ABNORMAL LOW

## 2010-12-18 LAB — URINALYSIS, ROUTINE W REFLEX MICROSCOPIC
Bilirubin Urine: NEGATIVE
Hgb urine dipstick: NEGATIVE
Protein, ur: NEGATIVE
Specific Gravity, Urine: 1.007
pH: 6.5

## 2010-12-18 LAB — PROTIME-INR: INR: 0.9

## 2011-01-28 ENCOUNTER — Other Ambulatory Visit (INDEPENDENT_AMBULATORY_CARE_PROVIDER_SITE_OTHER): Payer: Self-pay | Admitting: *Deleted

## 2011-01-28 NOTE — Telephone Encounter (Signed)
The patient called the office and states that he does have a office appointment with Dr. Karilyn Cota in November. He needed reill called in to [facility] for his Nexium 40 mg Take 1 by mouth 30 minutes before breakfast #90 with 3 refills, per Delrae Rend approval. This was called and given to Buel Ream. Patient was called and made aware.

## 2011-02-24 ENCOUNTER — Encounter (INDEPENDENT_AMBULATORY_CARE_PROVIDER_SITE_OTHER): Payer: Self-pay | Admitting: Internal Medicine

## 2011-02-24 ENCOUNTER — Ambulatory Visit (INDEPENDENT_AMBULATORY_CARE_PROVIDER_SITE_OTHER): Payer: 59 | Admitting: Internal Medicine

## 2011-02-24 DIAGNOSIS — Z860100 Personal history of colon polyps, unspecified: Secondary | ICD-10-CM | POA: Insufficient documentation

## 2011-02-24 DIAGNOSIS — K2270 Barrett's esophagus without dysplasia: Secondary | ICD-10-CM | POA: Insufficient documentation

## 2011-02-24 DIAGNOSIS — K219 Gastro-esophageal reflux disease without esophagitis: Secondary | ICD-10-CM

## 2011-02-24 NOTE — Progress Notes (Signed)
Presenting complaint; Followup for chronic GERD. Subjective: Benjamin [name] is 60 year old Caucasian male who is here for yearly visit. He has chronic GERD complicated by short segment Barrett's esophagus which was initially diagnosed in 2004. In 2005 he had low grade dysplasia a followup EGD in December 2005 was negative for dysplasia. His last exam was in September 2011 and biopsy was negative for Barrett's. He has short segment Barrett's to begin with. He also has history of colonic adenomas and had colonoscopy at the time of EGD. He has no complaints. He denies dysphagia or heartburn. He also denies sore throat chronic cough or hoarseness. He has gained 4 pounds since his last visit 15 months ago. He has occasional hematochezia and he had an episode 4 weeks ago and used suppositories for few days with relief. He has external hemorrhoids. He is still taking Nexium even though be sent in a prescription for dexilant. He does not do any scheduled exercise but he stays busy and walks a lot. Current Medications: Current Outpatient Prescriptions  Medication Sig Dispense Refill  . Albuterol Sulfate (VENTOLIN HFA IN) Inhale into the lungs. As needed        . aspirin 81 MG tablet Take 81 mg by mouth daily.        . clopidogrel (PLAVIX) 75 MG tablet Take 75 mg by mouth daily.        Marland Kitchen esomeprazole (NEXIUM) 40 MG capsule Take 40 mg by mouth daily before breakfast.        . ezetimibe-simvastatin (VYTORIN) 10-40 MG per tablet Take 1 tablet by mouth daily.        Marland Kitchen gabapentin (NEURONTIN) 600 MG tablet Take 600 mg by mouth 2 (two) times daily. At Bedtime       . lisinopril-hydrochlorothiazide (PRINZIDE,ZESTORETIC) 20-12.5 MG per tablet Take 1 tablet by mouth daily.        . metFORMIN (GLUCOPHAGE) 500 MG tablet Take 500 mg by mouth 2 (two) times daily.        . metoprolol tartrate (LOPRESSOR) 25 MG tablet Take 25 mg by mouth. 1/2 tablet twice a day       . tiotropium (SPIRIVA HANDIHALER) 18 MCG inhalation capsule  Place 18 mcg into inhaler and inhale as needed.          Objective: BP 120/74  Pulse 76  Temp(Src) 97.7 F (36.5 C) (Oral)  Resp 18  Ht 5\' 9"  (1.753 m)  Wt 168 lb (76.204 kg)  BMI 24.81 kg/m2 Conjunctiva is pink.sclerae nonicteric. Oropharyngeal mucosa is normal. No neck masses or thyromegaly noted.he has bilateral neck scar from previous carotid surgery Abdomen is full but soft and nontender without organomegaly or masses. He has small umbilicus hernia. No peripheral edema or clubbing noted    Assessment: #1. Chronic GERD complicated by short segment Barrett's esophagus. His symptoms are well-controlled with therapy. He should switch to Dexilant in order to prevent interaction with Plavix which usually is the case with Nexium. #2. History of colonic polyps. Next colonoscopy would be in September 2016.   Plan: Discontinue Nexium and start Dexilant 60 mg by mouth daily 30 minutes before breakfast. Call if Dexilant does not work or you have side effects. Next office visit in one year

## 2011-02-24 NOTE — Patient Instructions (Addendum)
Please note that you are now on Dexilant to minimize interaction with Plavix or clopidogrel. Notify if Dexilant  does not work or you have side effects Continue anti-reflux measures.

## 2011-04-09 ENCOUNTER — Ambulatory Visit (INDEPENDENT_AMBULATORY_CARE_PROVIDER_SITE_OTHER): Payer: 59 | Admitting: Cardiology

## 2011-04-09 ENCOUNTER — Encounter: Payer: Self-pay | Admitting: Cardiology

## 2011-04-09 VITALS — BP 135/76 | HR 85 | Ht 68.0 in | Wt 161.0 lb

## 2011-04-09 DIAGNOSIS — I2510 Atherosclerotic heart disease of native coronary artery without angina pectoris: Secondary | ICD-10-CM

## 2011-04-09 MED ORDER — METOPROLOL TARTRATE 25 MG PO TABS: 12.5000 mg | ORAL_TABLET | Freq: Two times a day (BID) | ORAL | Status: DC

## 2011-04-09 MED ORDER — CLOPIDOGREL BISULFATE 75 MG PO TABS: 75.0000 mg | ORAL_TABLET | Freq: Every day | ORAL | Status: DC

## 2011-04-09 NOTE — Patient Instructions (Signed)
Your physician recommends that you schedule a follow-up appointment in: 1 year. You will receive a reminder letter in the mail 1-2 months in advance reminding you to call our office to schedule your appointment. If you don't receive this letter, please contact our office.  Your physician recommends that you continue on your current medications as directed. Please refer to the Current Medication list given to you today. Refills have been sent to [facility] for your requested refills.

## 2011-04-09 NOTE — Progress Notes (Signed)
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 From a cardiac standpoint the patient is doing well. He denies any chest pain. He reports no shortness of breath orthopnea. He still smokes 3 packs a day. He has been evaluated for aortic aneurysm in 2011 and was negative. He reports no palpitations presyncope or syncope  PMH: reviewed and listed in Problem List in Electronic Records (and see below) Past Medical History  Diagnosis Date  . GERD (gastroesophageal reflux disease)   . Hypertension   . Diabetes mellitus   . Barrett esophagus    Past Surgical History  Procedure Date  . Colonoscopy   . Upper gastrointestinal endoscopy   . Hernia repair   . Cardiac surgery   . Carotid artery angioplasty     Bilateral    Allergies/SH/FHX : available in Electronic Records for review  No Known Allergies History   Social History  . Marital Status: Married    Spouse Name: N/A    Number of Children: N/A  . Years of Education: N/A   Occupational History  . Not on file.   Social History Main Topics  . Smoking status: Current Everyday Smoker -- 3.0 packs/day for 52 years    Types: Cigarettes  . Smokeless tobacco: Never Used   Comment: Patient smokes 3 packs a day  . Alcohol Use: Yes     Patient states that he drinks a 6 pack a day  . Drug Use: No  . Sexually Active: Not on file   Other Topics Concern  . Not on file   Social History Narrative  . No narrative on file   Family History  Problem Relation Age of Onset  . Heart disease Father   . Rheum arthritis Sister   . Hypertension Sister   . Healthy Daughter   . Healthy Son   . Healthy Son     Medications: Current Outpatient Prescriptions    Medication Sig Dispense Refill  . Albuterol Sulfate (VENTOLIN HFA IN) Inhale into the lungs. As needed        . aspirin 81 MG tablet Take 81 mg by mouth daily.        . clopidogrel (PLAVIX) 75 MG tablet Take 1 tablet (75 mg total) by mouth daily.  90 tablet  3  . dexlansoprazole (DEXILANT) 60 MG capsule Take 60 mg by mouth daily.        Marland Kitchen ezetimibe-simvastatin (VYTORIN) 10-40 MG per tablet Take 1 tablet by mouth daily.        Marland Kitchen gabapentin (NEURONTIN) 600 MG tablet Take 600 mg by mouth 2 (two) times daily. At Bedtime       . lisinopril-hydrochlorothiazide (PRINZIDE,ZESTORETIC) 20-12.5 MG per tablet Take 1 tablet by mouth daily.        . metFORMIN (GLUCOPHAGE) 500 MG tablet Take 500 mg by mouth 3 (three) times daily.       . metoprolol tartrate (LOPRESSOR) 25 MG tablet Take 0.5 tablets (12.5 mg total) by mouth 2 (two) times daily.  90 tablet  3  . tiotropium (SPIRIVA HANDIHALER) 18 MCG inhalation capsule Place 18 mcg into inhaler and inhale as needed.          ROS: No nausea or vomiting. No fever or chills.No  melena or hematochezia.No bleeding.No claudication  Physical Exam: BP 135/76  Pulse 85  Ht 5\' 8"  (1.727 m)  Wt 161 lb (73.029 kg)  BMI 24.48 kg/m2 General: Well-nourished white male in no apparent distress Neck: Normal carotid upstroke with bilateral carotid endarterectomy scars well-healed. No definite bruits. No thyromegaly nonnodular thyroid. Lungs: Clear breath sounds bilaterally somewhat diminished. Cardiac: Regular rate and rhythm. Normal S1-S2. No pathological murmurs Vascular: No edema. Normal distal peripheral pulses including posterior tibial pulses bilaterally Skin: Warm and dry Physcologic: Normal affect  12lead ECG: Normal sinus rhythm no acute ischemic changes Limited bedside ECHO:N/A   Patient Active Problem List  Diagnoses   HYPERLIPIDEMIA-MIXED   HYPERTENSION, UNSPECIFIED-controlled    CAD, ARTERY BYPASS GRAFT STATUS post coronary bypass grafting 2006     GERD (gastroesophageal reflux disease)   Barrett's esophagus   History of colonic polyps  Ruled out for aortic aneurysm by ultrasound 2011   Ischemic cardiomyopathy ejection fraction 35-40% echocardiogram 2011   Tobacco use   Carotid artery disease status post bilateral carotid endarterectomies followed by Dr. Amado Nash   From a cardiac standpoint the patient is doing well. He reports no chest pain or shortness of breath.  I counseled him again regarding his to excessive tobacco use.  His ejection fraction remains stable at 35-40% but will need to be followed in the future. We will obtain an echocardiogram during the next clinic visit  Carotid artery disease is followed closely by Dr. Hart Rochester.  Patient has been ruled out for aortic aneurysm.

## 2011-05-13 ENCOUNTER — Encounter: Payer: Self-pay | Admitting: Physician Assistant

## 2011-05-14 ENCOUNTER — Ambulatory Visit (INDEPENDENT_AMBULATORY_CARE_PROVIDER_SITE_OTHER): Payer: 59 | Admitting: Thoracic Diseases

## 2011-05-14 ENCOUNTER — Other Ambulatory Visit (INDEPENDENT_AMBULATORY_CARE_PROVIDER_SITE_OTHER): Payer: 59 | Admitting: *Deleted

## 2011-05-14 VITALS — BP 130/70 | HR 70 | Resp 16 | Ht 68.0 in | Wt 161.0 lb

## 2011-05-14 DIAGNOSIS — I6529 Occlusion and stenosis of unspecified carotid artery: Secondary | ICD-10-CM

## 2011-05-14 DIAGNOSIS — Z48812 Encounter for surgical aftercare following surgery on the circulatory system: Secondary | ICD-10-CM

## 2011-05-14 NOTE — Progress Notes (Signed)
VASCULAR AND VEIN SURGERY CAROTID FOLLOW-UP  Date of Surgery: 07/30/04 R CEA; 04/22/07 Surgeon: Kipp Brood  HPI: Benjamin [name] is a 61 y.o. male  who has known carotid disease. Patient is doing well. Pt. has had surgical intervention of bilateral CEA .  Patient has Negative history of TIA or stroke symptom.  The patient denies amaurosis fugax or monocular blindness.  The patient  denies facial drooping.   Pt. denies headache Pt. denies hemiplegia.  The patient denies receptive or expressive aphasia.  Pt. denies weakness in BUE/BLE  Pt. States that he has tried to stop smoking. Did not tolerate chantix  Non-Invasive Vascular Imaging CAROTID DUPLEX 05/14/2011  Right ICA 20 - 39 % stenosis Left ICA 20 - 39 % stenosis  These findings are Unchanged from previous exam Past Medical History  Diagnosis Date  . GERD (gastroesophageal reflux disease)   . Hypertension   . Diabetes mellitus   . Barrett esophagus    History   Social History  . Marital Status: Married    Spouse Name: N/A    Number of Children: N/A  . Years of Education: N/A   Occupational History  . Not on file.   Social History Main Topics  . Smoking status: Current Everyday Smoker -- 3.0 packs/day for 52 years    Types: Cigarettes  . Smokeless tobacco: Never Used   Comment: Patient smokes 3 packs a day  . Alcohol Use: Yes     Patient states that he drinks a 6 pack a day  . Drug Use: No  . Sexually Active: Not on file   Other Topics Concern  . Not on file   Social History Narrative  . No narrative on file   Current Outpatient Prescriptions  Medication Sig Dispense Refill  . Albuterol Sulfate (VENTOLIN HFA IN) Inhale into the lungs. As needed        . aspirin 81 MG tablet Take 81 mg by mouth daily.        . clopidogrel (PLAVIX) 75 MG tablet Take 1 tablet (75 mg total) by mouth daily.  90 tablet  3  . dexlansoprazole (DEXILANT) 60 MG capsule Take 60 mg by mouth daily.        Marland Kitchen ezetimibe-simvastatin (VYTORIN) 10-40  MG per tablet Take 1 tablet by mouth daily.        Marland Kitchen gabapentin (NEURONTIN) 600 MG tablet Take 600 mg by mouth 2 (two) times daily. At Bedtime       . lisinopril-hydrochlorothiazide (PRINZIDE,ZESTORETIC) 20-12.5 MG per tablet Take 1 tablet by mouth daily.        . metFORMIN (GLUCOPHAGE) 500 MG tablet Take 500 mg by mouth 3 (three) times daily.       . metoprolol tartrate (LOPRESSOR) 25 MG tablet Take 0.5 tablets (12.5 mg total) by mouth 2 (two) times daily.  90 tablet  3  . tiotropium (SPIRIVA HANDIHALER) 18 MCG inhalation capsule Place 18 mcg into inhaler and inhale as needed.         Past Surgical History  Procedure Date  . Colonoscopy   . Upper gastrointestinal endoscopy   . Hernia repair   . Cardiac surgery   . Carotid artery angioplasty     Bilateral   Physical Exam:  HR 70 Filed Vitals:   05/14/11 1447  Height: 5\' 8"  (1.727 m)  Weight: 161 lb (73.029 kg)   Pt is A&O x 3 Gait is normal Negative Bilateral carotid bruit/s Neuro Exam: Speech is fluent Negative weakness  BUE/BLE Negative tongue deviation Negative facial droop  Plan: Follow-up in 1 years with Carotid Duplex scan and appt Encouraged pt to stop smoking  Pt was given information regarding stroke symptoms and prevention  Clinic MD: [facility]

## 2011-05-25 NOTE — Procedures (Unsigned)
CAROTID DUPLEX EXAM  INDICATION:  Bilateral carotid endarterectomies.  HISTORY: Diabetes:  yes Cardiac:  CABG Hypertension:  yes Smoking:  yes Previous Surgery:  Right carotid endarterectomy 07/30/2004, left carotid endarterectomy 04/22/2007. CV History:  Currently asymptomatic. Amaurosis Fugax No, Paresthesias No, Hemiparesis No                                      RIGHT             LEFT Brachial systolic pressure:         130               115 Brachial Doppler waveforms:         Normal            Normal Vertebral direction of flow:        Antegrade         Antegrade DUPLEX VELOCITIES (cm/sec) CCA peak systolic                   132               83 ECA peak systolic                   145               103 ICA peak systolic                   113               126 ICA end diastolic                   37                41 PLAQUE MORPHOLOGY: PLAQUE AMOUNT:                      None              None PLAQUE LOCATION:  IMPRESSION:  Patent bilateral carotid endarterectomy sites with no evidence of re-stenosis.  Velocities most likely due to change in vessel diameter.  Stable in comparison to the previous exam.  ___________________________________________ Quita Skye. Hart Rochester, M.D.  EM/MEDQ  D:  05/15/2011  T:  05/15/2011  Job:  [id number]

## 2012-01-19 ENCOUNTER — Other Ambulatory Visit (INDEPENDENT_AMBULATORY_CARE_PROVIDER_SITE_OTHER): Payer: Self-pay | Admitting: Internal Medicine

## 2012-02-03 ENCOUNTER — Encounter (INDEPENDENT_AMBULATORY_CARE_PROVIDER_SITE_OTHER): Payer: Self-pay | Admitting: *Deleted

## 2012-02-18 ENCOUNTER — Encounter (INDEPENDENT_AMBULATORY_CARE_PROVIDER_SITE_OTHER): Payer: Self-pay | Admitting: Internal Medicine

## 2012-02-18 ENCOUNTER — Ambulatory Visit (INDEPENDENT_AMBULATORY_CARE_PROVIDER_SITE_OTHER): Payer: 59 | Admitting: Internal Medicine

## 2012-02-18 VITALS — BP 110/50 | HR 72 | Temp 97.8°F | Ht 69.0 in | Wt 169.8 lb

## 2012-02-18 DIAGNOSIS — K2270 Barrett's esophagus without dysplasia: Secondary | ICD-10-CM

## 2012-02-18 DIAGNOSIS — D126 Benign neoplasm of colon, unspecified: Secondary | ICD-10-CM

## 2012-02-18 DIAGNOSIS — K635 Polyp of colon: Secondary | ICD-10-CM

## 2012-02-18 NOTE — Progress Notes (Signed)
Subjective:     Patient ID: Benjamin [name], male   DOB: May 17, 1950, 61 y.o.   MRN: [id number]  HPI60 yr old male here for yearly visit. Hx of chronic GERD complicated by short segment Barrett's esophagus which was initially diagnosed in 2004. In 2005 he had low grade dysplasia. A follow up EGD in December 2005 was negative for dysplasias. Last exam was September 2011 and biopsy was negative for Barrett's.  He had a short segment Barrett's to begin with.  He also has history of colonic adenomas and had colonoscopy at the time of EGD. (2011) Normal colonoscopy except for external hemorrhoids. Appetite is good. No weight loss. BMs are normal. No melena or bright red rectal bleeding. He denies acid reflux.     Review of Systems see hpi Current Outpatient Prescriptions  Medication Sig Dispense Refill  . Albuterol Sulfate (VENTOLIN HFA IN) Inhale into the lungs. As needed        . aspirin 81 MG tablet Take 81 mg by mouth daily.        . clopidogrel (PLAVIX) 75 MG tablet Take 1 tablet (75 mg total) by mouth daily.  90 tablet  3  . DEXILANT 60 MG capsule TAKE 1 CAPSULE DAILY       BEFORE BREAKFAST  30 capsule  11  . ezetimibe-simvastatin (VYTORIN) 10-40 MG per tablet Take 1 tablet by mouth daily.        Marland Kitchen gabapentin (NEURONTIN) 600 MG tablet Take 600 mg by mouth 2 (two) times daily. At Bedtime       . lisinopril-hydrochlorothiazide (PRINZIDE,ZESTORETIC) 20-12.5 MG per tablet Take 1 tablet by mouth daily.        . metFORMIN (GLUCOPHAGE) 500 MG tablet Take 500 mg by mouth 3 (three) times daily.       . metoprolol tartrate (LOPRESSOR) 25 MG tablet Take 0.5 tablets (12.5 mg total) by mouth 2 (two) times daily.  90 tablet  3  . tiotropium (SPIRIVA HANDIHALER) 18 MCG inhalation capsule Place 18 mcg into inhaler and inhale as needed.         Past Medical History  Diagnosis Date  . GERD (gastroesophageal reflux disease)   . Hypertension   . Diabetes mellitus   . Barrett esophagus    Past Surgical  History  Procedure Date  . Colonoscopy   . Upper gastrointestinal endoscopy   . Hernia repair   . Cardiac surgery   . Carotid artery angioplasty     Bilateral    No Known Allergies      Objective:   Physical Exam  Filed Vitals:   02/18/12 1446  BP: 110/50  Pulse: 72  Temp: 97.8 F (36.6 C)  Height: 5\' 9"  (1.753 m)  Weight: 169 lb 12.8 oz (77.021 kg)   Alert and oriented. Skin warm and dry. Oral mucosa is moist.   . Sclera anicteric, conjunctivae is pink. Thyroid not enlarged. No cervical lymphadenopathy. Lungs clear. Heart regular rate and rhythm.  Abdomen is soft. Bowel sounds are positive. No hepatomegaly. No abdominal masses felt. No tenderness.  No edema to lower extremities.       Assessment:    Barrett's esophagus. At this time, he is asymptomatic. No acid reflux. Hx of colonic adenomas. Surveillance for both in 2016. No rectal bleeding.     Plan:    OV in 1 yr. Surveillance for colon polyps and Barrett's in 2016. I discussed this case with Dr. Karilyn Cota.

## 2012-02-18 NOTE — Patient Instructions (Addendum)
Follow-up in one year.

## 2012-04-22 ENCOUNTER — Encounter: Payer: Self-pay | Admitting: Cardiology

## 2012-04-22 DIAGNOSIS — E785 Hyperlipidemia, unspecified: Secondary | ICD-10-CM | POA: Insufficient documentation

## 2012-04-22 DIAGNOSIS — Z951 Presence of aortocoronary bypass graft: Secondary | ICD-10-CM | POA: Insufficient documentation

## 2012-04-22 DIAGNOSIS — I2510 Atherosclerotic heart disease of native coronary artery without angina pectoris: Secondary | ICD-10-CM | POA: Insufficient documentation

## 2012-04-22 DIAGNOSIS — I739 Peripheral vascular disease, unspecified: Secondary | ICD-10-CM | POA: Insufficient documentation

## 2012-04-22 DIAGNOSIS — Z789 Other specified health status: Secondary | ICD-10-CM | POA: Insufficient documentation

## 2012-04-22 DIAGNOSIS — I10 Essential (primary) hypertension: Secondary | ICD-10-CM | POA: Insufficient documentation

## 2012-04-22 DIAGNOSIS — Z720 Tobacco use: Secondary | ICD-10-CM | POA: Insufficient documentation

## 2012-04-22 DIAGNOSIS — I255 Ischemic cardiomyopathy: Secondary | ICD-10-CM | POA: Insufficient documentation

## 2012-04-25 ENCOUNTER — Ambulatory Visit (INDEPENDENT_AMBULATORY_CARE_PROVIDER_SITE_OTHER): Payer: 59 | Admitting: Cardiology

## 2012-04-25 ENCOUNTER — Encounter: Payer: Self-pay | Admitting: Cardiology

## 2012-04-25 VITALS — BP 128/80 | HR 77 | Ht 69.0 in | Wt 164.0 lb

## 2012-04-25 DIAGNOSIS — F17200 Nicotine dependence, unspecified, uncomplicated: Secondary | ICD-10-CM

## 2012-04-25 DIAGNOSIS — I519 Heart disease, unspecified: Secondary | ICD-10-CM

## 2012-04-25 DIAGNOSIS — I255 Ischemic cardiomyopathy: Secondary | ICD-10-CM

## 2012-04-25 DIAGNOSIS — E785 Hyperlipidemia, unspecified: Secondary | ICD-10-CM

## 2012-04-25 DIAGNOSIS — I779 Disorder of arteries and arterioles, unspecified: Secondary | ICD-10-CM

## 2012-04-25 DIAGNOSIS — I10 Essential (primary) hypertension: Secondary | ICD-10-CM

## 2012-04-25 DIAGNOSIS — I2510 Atherosclerotic heart disease of native coronary artery without angina pectoris: Secondary | ICD-10-CM

## 2012-04-25 DIAGNOSIS — Z720 Tobacco use: Secondary | ICD-10-CM

## 2012-04-25 DIAGNOSIS — I2589 Other forms of chronic ischemic heart disease: Secondary | ICD-10-CM

## 2012-04-25 MED ORDER — CLOPIDOGREL BISULFATE 75 MG PO TABS: 75.0000 mg | ORAL_TABLET | Freq: Every day | ORAL | Status: DC

## 2012-04-25 MED ORDER — METOPROLOL TARTRATE 25 MG PO TABS: 12.5000 mg | ORAL_TABLET | Freq: Two times a day (BID) | ORAL | Status: DC

## 2012-04-25 NOTE — Patient Instructions (Signed)
   Echo  Office will contact with results Continue all current medications. Your physician wants you to follow up in:  1 year.  You will receive a reminder letter in the mail one-two months in advance.  If you don't receive a letter, please call our office to schedule the follow up appointment

## 2012-04-25 NOTE — Assessment & Plan Note (Signed)
It is time for followup 2-D echo to be sure about his left ventricular function. I explained this to him. If he has worsening of LV function we will have to approach further medication changes and considered whether other devices are needed.

## 2012-04-25 NOTE — Progress Notes (Signed)
HPI   The patient is seen to followup cardiology care. Most recently over time the patient had been followed in our office by Dr. Andee Lineman. I had seen the patient in the remote past. The patient wants to continue and change his care to me going forward. I have reviewed all of the old records. I have updated the electronic medical record completely since his last visit and based on prior data.  The patient has known coronary disease. He underwent CABG 2006. He's not having any recurrent chest pain or shortness of breath. Unfortunately he continues to smoke. He does follow carefully with his primary care team. I have reviewed records from vascular surgery showing that he gets yearly followup after his carotid endarterectomies. I talked to him about this. He does have followup arranged in the next few months.  No Known Allergies  Current Outpatient Prescriptions  Medication Sig Dispense Refill  . Albuterol Sulfate (VENTOLIN HFA IN) Inhale into the lungs. As needed        . aspirin 81 MG tablet Take 81 mg by mouth daily.        . clopidogrel (PLAVIX) 75 MG tablet Take 1 tablet (75 mg total) by mouth daily.  90 tablet  3  . DEXILANT 60 MG capsule TAKE 1 CAPSULE DAILY       BEFORE BREAKFAST  30 capsule  11  . ezetimibe-simvastatin (VYTORIN) 10-40 MG per tablet Take 1 tablet by mouth daily.        Marland Kitchen gabapentin (NEURONTIN) 600 MG tablet Take 600 mg by mouth 2 (two) times daily. At Bedtime       . lisinopril-hydrochlorothiazide (PRINZIDE,ZESTORETIC) 20-12.5 MG per tablet Take 1 tablet by mouth daily.        . metFORMIN (GLUCOPHAGE) 500 MG tablet Take 500 mg by mouth 3 (three) times daily.       . metoprolol tartrate (LOPRESSOR) 25 MG tablet Take 0.5 tablets (12.5 mg total) by mouth 2 (two) times daily.  90 tablet  3  . tiotropium (SPIRIVA HANDIHALER) 18 MCG inhalation capsule Place 18 mcg into inhaler and inhale as needed.          History   Social History  . Marital Status: Married    Spouse  Name: N/A    Number of Children: N/A  . Years of Education: N/A   Occupational History  . Not on file.   Social History Main Topics  . Smoking status: Current Every Day Smoker -- 3.0 packs/day for 52 years    Types: Cigarettes  . Smokeless tobacco: Never Used     Comment: Patient smokes 3 packs a day  . Alcohol Use: Yes     Comment: Patient states that he drinks a 6 pack a day  . Drug Use: No  . Sexually Active: Not on file   Other Topics Concern  . Not on file   Social History Narrative  . No narrative on file    Family History  Problem Relation Age of Onset  . Heart disease Father   . Rheum arthritis Sister   . Hypertension Sister   . Healthy Daughter   . Healthy Son   . Healthy Son     Past Medical History  Diagnosis Date  . GERD (gastroesophageal reflux disease)   . Hypertension   . Diabetes mellitus   . Barrett esophagus   . CAD (coronary artery disease)   . Hx of CABG     CABG, 2006  .  PAD (peripheral artery disease)     No  aortic aneurysm 2011  . Tobacco abuse   . Carotid artery disease     Status post bilateral carotid endarterectomies  . Alcohol ingestion of more than four drinks per day     6 pack of beer per day  . Ischemic cardiomyopathy     EF 35-40%, echo, 2011  . Dyslipidemia     Past Surgical History  Procedure Date  . Colonoscopy   . Upper gastrointestinal endoscopy   . Hernia repair   . Cardiac surgery   . Carotid artery angioplasty     Bilateral    Patient Active Problem List  Diagnosis  . GERD (gastroesophageal reflux disease)  . Barrett's esophagus  . History of colonic polyps  . Hypertension  . Barrett esophagus  . CAD (coronary artery disease)  . Hx of CABG  . PAD (peripheral artery disease)  . Tobacco abuse  . Carotid artery disease  . Alcohol ingestion of more than four drinks per day  . Ischemic cardiomyopathy  . Dyslipidemia    ROS   Patient denies fever, chills, headache, sweats, rash, change in vision,  change in hearing, chest pain, cough, nausea vomiting, urinary symptoms. All other systems are reviewed and are negative.  PHYSICAL EXAM  Filed Vitals:   04/25/12 1020  BP: 128/80  Pulse: 77  Height: 5\' 9"  (1.753 m)  Weight: 164 lb (74.39 kg)   EKG is done today and reviewed by me. There is some decrease in R wave in V2. This is positional. There is sinus rhythm. There is no significant change since the prior tracing of January, 2013.  ASSESSMENT & PLAN

## 2012-04-25 NOTE — Assessment & Plan Note (Signed)
Patient's lipids are stable and being followed carefully by his primary care team.  As part of today's evaluation I spent greater than 25 minutes reviewing all information in talking to him and examining him. More than half of this time was spent with direct contact with him discussing his care including his smoking.

## 2012-04-25 NOTE — Assessment & Plan Note (Signed)
The patient continues to smoke. I spent time with him and urged him to stop.

## 2012-04-25 NOTE — Assessment & Plan Note (Signed)
Blood pressures control. No change in therapy. 

## 2012-04-25 NOTE — Assessment & Plan Note (Signed)
Coronary disease is stable. The patient is on the appropriate medications. He's not having any significant symptoms. He does not need any type of stress test at this time.

## 2012-04-25 NOTE — Assessment & Plan Note (Signed)
He has significant carotid artery disease. He will followup with Dr. Hart Rochester.

## 2012-05-02 ENCOUNTER — Other Ambulatory Visit: Payer: Self-pay | Admitting: *Deleted

## 2012-05-02 DIAGNOSIS — I6529 Occlusion and stenosis of unspecified carotid artery: Secondary | ICD-10-CM

## 2012-05-02 DIAGNOSIS — Z48812 Encounter for surgical aftercare following surgery on the circulatory system: Secondary | ICD-10-CM

## 2012-05-11 ENCOUNTER — Other Ambulatory Visit: Payer: Self-pay

## 2012-05-11 ENCOUNTER — Other Ambulatory Visit (INDEPENDENT_AMBULATORY_CARE_PROVIDER_SITE_OTHER): Payer: 59

## 2012-05-11 DIAGNOSIS — I2510 Atherosclerotic heart disease of native coronary artery without angina pectoris: Secondary | ICD-10-CM

## 2012-05-11 DIAGNOSIS — I519 Heart disease, unspecified: Secondary | ICD-10-CM

## 2012-05-11 DIAGNOSIS — I2589 Other forms of chronic ischemic heart disease: Secondary | ICD-10-CM

## 2012-05-12 ENCOUNTER — Other Ambulatory Visit: Payer: 59

## 2012-05-13 ENCOUNTER — Encounter: Payer: Self-pay | Admitting: Neurosurgery

## 2012-05-14 ENCOUNTER — Encounter: Payer: Self-pay | Admitting: Cardiology

## 2012-05-16 ENCOUNTER — Encounter: Payer: Self-pay | Admitting: Neurosurgery

## 2012-05-16 ENCOUNTER — Telehealth: Payer: Self-pay | Admitting: *Deleted

## 2012-05-16 ENCOUNTER — Ambulatory Visit (INDEPENDENT_AMBULATORY_CARE_PROVIDER_SITE_OTHER): Payer: 59 | Admitting: Neurosurgery

## 2012-05-16 ENCOUNTER — Other Ambulatory Visit: Payer: Self-pay | Admitting: *Deleted

## 2012-05-16 ENCOUNTER — Other Ambulatory Visit (INDEPENDENT_AMBULATORY_CARE_PROVIDER_SITE_OTHER): Payer: 59 | Admitting: *Deleted

## 2012-05-16 VITALS — BP 165/88 | HR 73 | Resp 16 | Ht 69.0 in | Wt 162.0 lb

## 2012-05-16 DIAGNOSIS — I6529 Occlusion and stenosis of unspecified carotid artery: Secondary | ICD-10-CM

## 2012-05-16 DIAGNOSIS — Z48812 Encounter for surgical aftercare following surgery on the circulatory system: Secondary | ICD-10-CM

## 2012-05-16 NOTE — Telephone Encounter (Signed)
Notes Recorded by Lesle Chris, LPN on 04/17/1476 at 11:54 AM Patient notified.

## 2012-05-16 NOTE — Telephone Encounter (Signed)
Message copied by Lesle Chris on Mon May 16, 2012 11:55 AM ------      Message from: Benjamin [name]      Created: Sat May 14, 2012  4:06 PM       Please let him know that his echo looks good. Muscle function of his heart has improved since the last test. Plan to continue his same medications ------

## 2012-05-16 NOTE — Progress Notes (Signed)
[facility] [place] Carotid Office Note  CC: Carotid surveillance Referring Physician: Hart Rochester  History of Present Illness: 62 year old male patient of Dr. Hart Rochester status post right CEA in 2006 and left CEA in 2009. The patient denies any signs or symptoms of CVA, TIA, amaurosis fugax or any neural deficit. The patient denies any new medical diagnoses or recent surgery.  Past Medical History  Diagnosis Date  . GERD (gastroesophageal reflux disease)   . Hypertension   . Diabetes mellitus   . Barrett esophagus   . CAD (coronary artery disease)   . Hx of CABG     CABG, 2006  . PAD (peripheral artery disease)     No  aortic aneurysm 2011  . Tobacco abuse   . Carotid artery disease     Status post bilateral carotid endarterectomies  . Alcohol ingestion of more than four drinks per day     6 pack of beer per day  . Ischemic cardiomyopathy     EF 35-40%, echo, 2011  . Dyslipidemia   . COPD (chronic obstructive pulmonary disease)     ROS: [x]  Positive   [ ]  Denies    General: [ ]  Weight loss, [ ]  Fever, [ ]  chills Neurologic: [ ]  Dizziness, [ ]  Blackouts, [ ]  Seizure [ ]  Stroke, [ ]  "Mini stroke", [ ]  Slurred speech, [ ]  Temporary blindness; [ ]  weakness in arms or legs, [ ]  Hoarseness Cardiac: [ ]  Chest pain/pressure, [ ]  Shortness of breath at rest [ ]  Shortness of breath with exertion, [ ]  Atrial fibrillation or irregular heartbeat Vascular: [ ]  Pain in legs with walking, [ ]  Pain in legs at rest, [ ]  Pain in legs at night,  [ ]  Non-healing ulcer, [ ]  Blood clot in vein/DVT,   Pulmonary: [ ]  Home oxygen, [ ]  Productive cough, [ ]  Coughing up blood, [ ]  Asthma,  [ ]  Wheezing Musculoskeletal:  [ ]  Arthritis, [ ]  Low back pain, [ ]  Joint pain Hematologic: [ ]  Easy Bruising, [ ]  Anemia; [ ]  Hepatitis Gastrointestinal: [ ]  Blood in stool, [ ]  Gastroesophageal Reflux/heartburn, [ ]  Trouble swallowing Urinary: [ ]  chronic Kidney disease, [ ]  on HD - [ ]  MWF or [ ]   TTHS, [ ]  Burning with urination, [ ]  Difficulty urinating Skin: [ ]  Rashes, [ ]  Wounds Psychological: [ ]  Anxiety, [ ]  Depression   Social History History  Substance Use Topics  . Smoking status: Current Every Day Smoker -- 3.00 packs/day for 52 years    Types: Cigarettes  . Smokeless tobacco: Never Used     Comment: Patient smokes 3 packs a day  . Alcohol Use: Yes     Comment: Patient states that he drinks a 6 pack a day    Family History Family History  Problem Relation Age of Onset  . Heart disease Father   . Hypertension Father   . Heart attack Father   . Rheum arthritis Sister   . Hypertension Sister   . Healthy Daughter   . Healthy Son   . Healthy Son   . Cancer Mother   . Hypertension Mother     Allergies  Allergen Reactions  . Morphine And Related     MIGRAINE    Current Outpatient Prescriptions  Medication Sig Dispense Refill  . lisinopril (PRINIVIL,ZESTRIL) 20 MG tablet Take 20 mg by mouth daily.      . Albuterol Sulfate (VENTOLIN HFA IN) Inhale into the lungs. As  needed        . aspirin 81 MG tablet Take 81 mg by mouth daily.        . clopidogrel (PLAVIX) 75 MG tablet Take 1 tablet (75 mg total) by mouth daily.  90 tablet  3  . DEXILANT 60 MG capsule TAKE 1 CAPSULE DAILY       BEFORE BREAKFAST  30 capsule  11  . ezetimibe-simvastatin (VYTORIN) 10-40 MG per tablet Take 1 tablet by mouth daily.        Marland Kitchen gabapentin (NEURONTIN) 600 MG tablet Take 600 mg by mouth 2 (two) times daily. At Bedtime       . lisinopril-hydrochlorothiazide (PRINZIDE,ZESTORETIC) 20-12.5 MG per tablet Take 1 tablet by mouth daily.        . metFORMIN (GLUCOPHAGE) 500 MG tablet Take 500 mg by mouth 3 (three) times daily.       . metoprolol tartrate (LOPRESSOR) 25 MG tablet Take 0.5 tablets (12.5 mg total) by mouth 2 (two) times daily.  90 tablet  3  . tiotropium (SPIRIVA HANDIHALER) 18 MCG inhalation capsule Place 18 mcg into inhaler and inhale as needed.         No current  facility-administered medications for this visit.    Physical Examination  Filed Vitals:   05/16/12 1045  BP: 165/88  Pulse: 73  Resp:     Body mass index is 23.91 kg/(m^2).  General:  WDWN in NAD Gait: Normal HEENT: WNL Eyes: Pupils equal Pulmonary: normal non-labored breathing , without Rales, rhonchi,  wheezing Cardiac: RRR, without  Murmurs, rubs or gallops; Abdomen: soft, NT, no masses Skin: no rashes, ulcers noted  Vascular Exam Pulses: 3+ radial pulses bilaterally Carotid bruits: Carotid pulses to auscultation no bruits are heard Extremities without ischemic changes, no Gangrene , no cellulitis; no open wounds;  Musculoskeletal: no muscle wasting or atrophy   Neurologic: A&O X 3; Appropriate Affect ; SENSATION: normal; MOTOR FUNCTION:  moving all extremities equally. Speech is fluent/normal  Non-Invasive Vascular Imaging CAROTID DUPLEX 05/16/2012  Right ICA 0 - 19% stenosis Left ICA 0 - 19% stenosis   ASSESSMENT/PLAN: Asymptomatic patient with widely patent right and left carotid endarterectomy sites. The patient will followup in one year with repeat carotid duplex. The patient's questions were encouraged and answered, he is in agreement with this plan.  Lauree Chandler ANP   Clinic MD: Myra Gianotti

## 2012-06-06 ENCOUNTER — Telehealth (INDEPENDENT_AMBULATORY_CARE_PROVIDER_SITE_OTHER): Payer: Self-pay | Admitting: *Deleted

## 2012-06-06 NOTE — Telephone Encounter (Signed)
Benjamin [name] would like for Benjamin [name] to return his call to [phone]. He is needing a 90 day supply on his medication and was told call. Benjamin [name] is also having trouble with diarrhea and would like to speak with Benjamin [name] about this.

## 2012-06-07 ENCOUNTER — Telehealth (INDEPENDENT_AMBULATORY_CARE_PROVIDER_SITE_OTHER): Payer: Self-pay | Admitting: Internal Medicine

## 2012-06-07 MED ORDER — ESOMEPRAZOLE MAGNESIUM 40 MG PO CPDR: 40.0000 mg | DELAYED_RELEASE_CAPSULE | Freq: Every day | ORAL | Status: DC

## 2012-06-07 NOTE — Telephone Encounter (Signed)
Thinks the Dexilant is causing his diarrhea. Diarrhea started after starting the Dexilant. Says he has diarrhea every day. Will switch back to Nexium and he will take the Nexium at night. Rx for Nexium has been eprescribed to his pharmacy.

## 2012-06-15 NOTE — Telephone Encounter (Signed)
This has been addressed.

## 2013-02-22 ENCOUNTER — Encounter (INDEPENDENT_AMBULATORY_CARE_PROVIDER_SITE_OTHER): Payer: Self-pay | Admitting: *Deleted

## 2013-05-01 ENCOUNTER — Encounter: Payer: Self-pay | Admitting: Cardiology

## 2013-05-01 DIAGNOSIS — R9430 Abnormal result of cardiovascular function study, unspecified: Secondary | ICD-10-CM | POA: Insufficient documentation

## 2013-05-03 ENCOUNTER — Encounter: Payer: Self-pay | Admitting: Cardiology

## 2013-05-03 ENCOUNTER — Ambulatory Visit (INDEPENDENT_AMBULATORY_CARE_PROVIDER_SITE_OTHER): Payer: 59 | Admitting: Cardiology

## 2013-05-03 VITALS — BP 102/61 | HR 82 | Ht 69.0 in | Wt 154.8 lb

## 2013-05-03 DIAGNOSIS — F17200 Nicotine dependence, unspecified, uncomplicated: Secondary | ICD-10-CM

## 2013-05-03 DIAGNOSIS — I2510 Atherosclerotic heart disease of native coronary artery without angina pectoris: Secondary | ICD-10-CM

## 2013-05-03 DIAGNOSIS — I255 Ischemic cardiomyopathy: Secondary | ICD-10-CM

## 2013-05-03 DIAGNOSIS — Z720 Tobacco use: Secondary | ICD-10-CM

## 2013-05-03 DIAGNOSIS — I2589 Other forms of chronic ischemic heart disease: Secondary | ICD-10-CM

## 2013-05-03 NOTE — Assessment & Plan Note (Signed)
Fortunately his ejection fraction improved to 55%. No change in therapy.

## 2013-05-03 NOTE — Assessment & Plan Note (Signed)
He continues to smoke. I've counseled him to stop.

## 2013-05-03 NOTE — Progress Notes (Signed)
HPI  Patient is seen today to followup coronary disease. I saw him last January, 2014. He is feeling well. He had a followup Doppler study at vascular surgery office. He had 0-19% bilateral with patent carotid endarterectomy sites. He has a followup visit with him. He also had a followup 2-D echo. His ejection fraction had improved and I was quite pleased with this.  Allergies  Allergen Reactions  . Morphine And Related     MIGRAINE    Current Outpatient Prescriptions  Medication Sig Dispense Refill  . Albuterol Sulfate (VENTOLIN HFA IN) Inhale into the lungs. As needed        . aspirin 81 MG tablet Take 81 mg by mouth daily.        . clopidogrel (PLAVIX) 75 MG tablet Take 1 tablet (75 mg total) by mouth daily.  90 tablet  3  . esomeprazole (NEXIUM) 40 MG capsule Take 1 capsule (40 mg total) by mouth daily.  90 capsule  4  . gabapentin (NEURONTIN) 600 MG tablet Take 600 mg by mouth 2 (two) times daily. At Bedtime       . lisinopril (PRINIVIL,ZESTRIL) 20 MG tablet Take 20 mg by mouth daily.      Marland Kitchen lisinopril-hydrochlorothiazide (PRINZIDE,ZESTORETIC) 20-12.5 MG per tablet Take 1 tablet by mouth daily.        . metFORMIN (GLUCOPHAGE) 500 MG tablet Take 500 mg by mouth 3 (three) times daily.       . metoprolol tartrate (LOPRESSOR) 25 MG tablet Take 0.5 tablets (12.5 mg total) by mouth 2 (two) times daily.  90 tablet  3  . simvastatin (ZOCOR) 40 MG tablet Take 40 mg by mouth daily.       Marland Kitchen tiotropium (SPIRIVA HANDIHALER) 18 MCG inhalation capsule Place 18 mcg into inhaler and inhale as needed.         No current facility-administered medications for this visit.    History   Social History  . Marital Status: Married    Spouse Name: N/A    Number of Children: N/A  . Years of Education: N/A   Occupational History  . Not on file.   Social History Main Topics  . Smoking status: Current Every Day Smoker -- 3.00 packs/day for 52 years    Types: Cigarettes  . Smokeless tobacco:  Never Used     Comment: Patient smokes 3 packs a day  . Alcohol Use: Yes     Comment: Patient states that he drinks a 6 pack a day  . Drug Use: No  . Sexual Activity: Not on file   Other Topics Concern  . Not on file   Social History Narrative  . No narrative on file    Family History  Problem Relation Age of Onset  . Heart disease Father   . Hypertension Father   . Heart attack Father   . Rheum arthritis Sister   . Hypertension Sister   . Healthy Daughter   . Healthy Son   . Healthy Son   . Cancer Mother   . Hypertension Mother     Past Medical History  Diagnosis Date  . GERD (gastroesophageal reflux disease)   . Hypertension   . Diabetes mellitus   . Barrett esophagus   . CAD (coronary artery disease)   . Hx of CABG     CABG, 2006  . PAD (peripheral artery disease)     No  aortic aneurysm 2011  . Tobacco abuse   .  Carotid artery disease     Status post bilateral carotid endarterectomies  . Alcohol ingestion of more than four drinks per day     6 pack of beer per day  . Ischemic cardiomyopathy     EF 35-40%, echo, 2011  . Dyslipidemia   . COPD (chronic obstructive pulmonary disease)   . Ejection fraction     Past Surgical History  Procedure Laterality Date  . Colonoscopy    . Upper gastrointestinal endoscopy    . Hernia repair    . Cardiac surgery    . Carotid artery angioplasty      Bilateral  . Carotid endarterectomy  Jul 30, 2004    Right cea  . Carotid endarterectomy  Jan. 23, 2009    Left cea    Patient Active Problem List   Diagnosis Date Noted  . Ejection fraction   . Hypertension   . Barrett esophagus   . CAD (coronary artery disease)   . Hx of CABG   . PAD (peripheral artery disease)   . Tobacco abuse   . Carotid artery disease   . Alcohol ingestion of more than four drinks per day   . Ischemic cardiomyopathy   . Dyslipidemia   . GERD (gastroesophageal reflux disease) 02/24/2011  . Barrett's esophagus 02/24/2011  . History of  colonic polyps 02/24/2011    ROS   Patient denies fever, chills, headache, sweats, rash, change in vision, change in hearing, chest pain, cough, nausea vomiting, urinary symptoms. All other systems are reviewed and are negative.  PHYSICAL EXAM   Patient smells of cigarettes. He is oriented to person time and place. Affect is normal. There is no jugulovenous distention. Lungs are clear. Respiratory effort is nonlabored. Cardiac exam reveals S1 and S2. There no clicks or significant murmurs. The abdomen is soft. There is no peripheral edema.  Filed Vitals:   05/03/13 0827  BP: 102/61  Pulse: 82  Height: 5\' 9"  (1.753 m)  Weight: 154 lb 12.8 oz (70.217 kg)   EKG is done today. There sinus rhythm. There is no significant EKG change.  ASSESSMENT & PLAN

## 2013-05-03 NOTE — Patient Instructions (Signed)

## 2013-05-03 NOTE — Assessment & Plan Note (Signed)
Coronary disease is stable. I've chosen not to press for any testing at this time.

## 2013-05-12 ENCOUNTER — Encounter: Payer: Self-pay | Admitting: Family

## 2013-05-15 ENCOUNTER — Ambulatory Visit: Payer: 59 | Admitting: Family

## 2013-05-15 ENCOUNTER — Other Ambulatory Visit (HOSPITAL_COMMUNITY): Payer: 59

## 2013-05-16 ENCOUNTER — Other Ambulatory Visit: Payer: 59

## 2013-05-16 ENCOUNTER — Ambulatory Visit: Payer: 59 | Admitting: Family

## 2013-05-19 ENCOUNTER — Encounter: Payer: Self-pay | Admitting: Family

## 2013-05-22 ENCOUNTER — Encounter: Payer: Self-pay | Admitting: Family

## 2013-05-22 ENCOUNTER — Ambulatory Visit (HOSPITAL_COMMUNITY)
Admission: RE | Admit: 2013-05-22 | Discharge: 2013-05-22 | Disposition: A | Discharge status: Home / Self Care | Payer: 59 | Source: Ambulatory Visit | Attending: Family | Admitting: Family

## 2013-05-22 ENCOUNTER — Ambulatory Visit (INDEPENDENT_AMBULATORY_CARE_PROVIDER_SITE_OTHER): Payer: 59 | Admitting: Family

## 2013-05-22 VITALS — BP 163/78 | HR 86 | Resp 16 | Ht 69.0 in | Wt 157.0 lb

## 2013-05-22 DIAGNOSIS — I6529 Occlusion and stenosis of unspecified carotid artery: Secondary | ICD-10-CM

## 2013-05-22 DIAGNOSIS — Z48812 Encounter for surgical aftercare following surgery on the circulatory system: Secondary | ICD-10-CM

## 2013-05-22 DIAGNOSIS — Z09 Encounter for follow-up examination after completed treatment for conditions other than malignant neoplasm: Secondary | ICD-10-CM | POA: Insufficient documentation

## 2013-05-22 DIAGNOSIS — Z9889 Other specified postprocedural states: Secondary | ICD-10-CM | POA: Insufficient documentation

## 2013-05-22 NOTE — Patient Instructions (Addendum)
Stroke Prevention Some medical conditions and behaviors are associated with an increased chance of having a stroke. You may prevent a stroke by making healthy choices and managing medical conditions. HOW CAN I REDUCE MY RISK OF HAVING A STROKE?   Stay physically active. Get at least 30 minutes of activity on most or all days.  Do not smoke. It may also be helpful to avoid exposure to secondhand smoke.  Limit alcohol use. Moderate alcohol use is considered to be:  No more than 2 drinks per day for men.  No more than 1 drink per day for nonpregnant women.  Eat healthy foods. This involves  Eating 5 or more servings of fruits and vegetables a day.  Following a diet that addresses high blood pressure (hypertension), high cholesterol, diabetes, or obesity.  Manage your cholesterol levels.  A diet low in saturated fat, trans fat, and cholesterol and high in fiber may control cholesterol levels.  Take any prescribed medicines to control cholesterol as directed by your health care provider.  Manage your diabetes.  A controlled-carbohydrate, controlled-sugar diet is recommended to manage diabetes.  Take any prescribed medicines to control diabetes as directed by your health care provider.  Control your hypertension.  A low-salt (sodium), low-saturated fat, low-trans fat, and low-cholesterol diet is recommended to manage hypertension.  Take any prescribed medicines to control hypertension as directed by your health care provider.  Maintain a healthy weight.  A reduced-calorie, low-sodium, low-saturated fat, low-trans fat, low-cholesterol diet is recommended to manage weight.  Stop drug abuse.  Avoid taking birth control pills.  Talk to your health care provider about the risks of taking birth control pills if you are over 35 years old, smoke, get migraines, or have ever had a blood clot.  Get evaluated for sleep disorders (sleep apnea).  Talk to your health care provider about  getting a sleep evaluation if you snore a lot or have excessive sleepiness.  Take medicines as directed by your health care provider.  For some people, aspirin or blood thinners (anticoagulants) are helpful in reducing the risk of forming abnormal blood clots that can lead to stroke. If you have the irregular heart rhythm of atrial fibrillation, you should be on a blood thinner unless there is a good reason you cannot take them.  Understand all your medicine instructions.  Make sure that other other conditions (such as anemia or atherosclerosis) are addressed. SEEK IMMEDIATE MEDICAL CARE IF:   You have sudden weakness or numbness of the face, arm, or leg, especially on one side of the body.  Your face or eyelid droops to one side.  You have sudden confusion.  You have trouble speaking (aphasia) or understanding.  You have sudden trouble seeing in one or both eyes.  You have sudden trouble walking.  You have dizziness.  You have a loss of balance or coordination.  You have a sudden, severe headache with no known cause.  You have new chest pain or an irregular heartbeat. Any of these symptoms may represent a serious problem that is an emergency. Do not wait to see if the symptoms will go away. Get medical help at once. Call your local emergency services  (911 in [place] Do not drive yourself to the hospital. Document Released: 04/23/2004 Document Revised: 01/04/2013 Document Reviewed: 09/16/2012 Exit[employer] Patient Information 2014 [employer].   Smoking Cessation Quitting smoking is important to your health and has many advantages. However, it is not always easy to quit since nicotine is a   very addictive drug. Often times, people try 3 times or more before being able to quit. This document explains the best ways for you to prepare to quit smoking. Quitting takes hard work and a lot of effort, but you can do it. ADVANTAGES OF QUITTING SMOKING  You will live longer, feel better,  and live better.  Your body will feel the impact of quitting smoking almost immediately.  Within 20 minutes, blood pressure decreases. Your pulse returns to its normal level.  After 8 hours, carbon monoxide levels in the blood return to normal. Your oxygen level increases.  After 24 hours, the chance of having a heart attack starts to decrease. Your breath, hair, and body stop smelling like smoke.  After 48 hours, damaged nerve endings begin to recover. Your sense of taste and smell improve.  After 72 hours, the body is virtually free of nicotine. Your bronchial tubes relax and breathing becomes easier.  After 2 to 12 weeks, lungs can hold more air. Exercise becomes easier and circulation improves.  The risk of having a heart attack, stroke, cancer, or lung disease is greatly reduced.  After 1 year, the risk of coronary heart disease is cut in half.  After 5 years, the risk of stroke falls to the same as a nonsmoker.  After 10 years, the risk of lung cancer is cut in half and the risk of other cancers decreases significantly.  After 15 years, the risk of coronary heart disease drops, usually to the level of a nonsmoker.  If you are pregnant, quitting smoking will improve your chances of having a healthy baby.  The people you live with, especially any children, will be healthier.  You will have extra money to spend on things other than cigarettes. QUESTIONS TO THINK ABOUT BEFORE ATTEMPTING TO QUIT You may want to talk about your answers with your caregiver.  Why do you want to quit?  If you tried to quit in the past, what helped and what did not?  What will be the most difficult situations for you after you quit? How will you plan to handle them?  Who can help you through the tough times? Your family? Friends? A caregiver?  What pleasures do you get from smoking? What ways can you still get pleasure if you quit? Here are some questions to ask your caregiver:  How can you  help me to be successful at quitting?  What medicine do you think would be best for me and how should I take it?  What should I do if I need more help?  What is smoking withdrawal like? How can I get information on withdrawal? GET READY  Set a quit date.  Change your environment by getting rid of all cigarettes, ashtrays, matches, and lighters in your home, car, or work. Do not let people smoke in your home.  Review your past attempts to quit. Think about what worked and what did not. GET SUPPORT AND ENCOURAGEMENT You have a better chance of being successful if you have help. You can get support in many ways.  Tell your family, friends, and co-workers that you are going to quit and need their support. Ask them not to smoke around you.  Get individual, group, or telephone counseling and support. Programs are available at local hospitals and health centers. Call your local health department for information about programs in your area.  Spiritual beliefs and practices may help some smokers quit.  Download a "quit meter" on your computer   to keep track of quit statistics, such as how long you have gone without smoking, cigarettes not smoked, and money saved.  Get a self-help book about quitting smoking and staying off of tobacco. LEARN NEW SKILLS AND BEHAVIORS  Distract yourself from urges to smoke. Talk to someone, go for a walk, or occupy your time with a task.  Change your normal routine. Take a different route to work. Drink tea instead of coffee. Eat breakfast in a different place.  Reduce your stress. Take a hot bath, exercise, or read a book.  Plan something enjoyable to do every day. Reward yourself for not smoking.  Explore interactive web-based programs that specialize in helping you quit. GET MEDICINE AND USE IT CORRECTLY Medicines can help you stop smoking and decrease the urge to smoke. Combining medicine with the above behavioral methods and support can greatly increase  your chances of successfully quitting smoking.  Nicotine replacement therapy helps deliver nicotine to your body without the negative effects and risks of smoking. Nicotine replacement therapy includes nicotine gum, lozenges, inhalers, nasal sprays, and skin patches. Some may be available over-the-counter and others require a prescription.  Antidepressant medicine helps people abstain from smoking, but how this works is unknown. This medicine is available by prescription.  Nicotinic receptor partial agonist medicine simulates the effect of nicotine in your brain. This medicine is available by prescription. Ask your caregiver for advice about which medicines to use and how to use them based on your health history. Your caregiver will tell you what side effects to look out for if you choose to be on a medicine or therapy. Carefully read the information on the package. Do not use any other product containing nicotine while using a nicotine replacement product.  RELAPSE OR DIFFICULT SITUATIONS Most relapses occur within the first 3 months after quitting. Do not be discouraged if you start smoking again. Remember, most people try several times before finally quitting. You may have symptoms of withdrawal because your body is used to nicotine. You may crave cigarettes, be irritable, feel very hungry, cough often, get headaches, or have difficulty concentrating. The withdrawal symptoms are only temporary. They are strongest when you first quit, but they will go away within 10 14 days. To reduce the chances of relapse, try to:  Avoid drinking alcohol. Drinking lowers your chances of successfully quitting.  Reduce the amount of caffeine you consume. Once you quit smoking, the amount of caffeine in your body increases and can give you symptoms, such as a rapid heartbeat, sweating, and anxiety.  Avoid smokers because they can make you want to smoke.  Do not let weight gain distract you. Many smokers will gain  weight when they quit, usually less than 10 pounds. Eat a healthy diet and stay active. You can always lose the weight gained after you quit.  Find ways to improve your mood other than smoking. FOR MORE INFORMATION  www.smokefree.gov  Document Released: 03/10/2001 Document Revised: 09/15/2011 Document Reviewed: 06/25/2011 [employer] Patient Information 2014 [employer].  

## 2013-05-22 NOTE — Addendum Note (Signed)
Addended by: Dorthula Rue L on: 05/22/2013 04:27 PM   Modules accepted: Orders

## 2013-05-22 NOTE — Progress Notes (Signed)
Established Carotid Patient   History of Present Illness  Benjamin [name] is a 63 y.o. male patient of Dr. Kellie Simmering status post right CEA in 2006 and left CEA in 2009. He returns today for routine surveillance. States he saw his cardiologist last month and his blood pressure was 102/ 60. His cardiologist is Dr. Ron Parker, with [facility]. Patient has Negative history of TIA or stroke symptom.  The patient denies amaurosis fugax or monocular blindness.  The patient  denies facial drooping.  Pt. denies hemiplegia.  The patient denies receptive or expressive aphasia.  Pt. denies extremity weakness.  Patient denies New Medical or Surgical History. He denies history of MI, but did have 1 vessel CABG in 2006. He has tingling in his feet all the time, states his legs have been tested for blood flow and is OK, per pt. He denies claudication symptoms, denies non-healing wounds.  Pt Diabetic: Yes, states well controlled Pt smoker: smoker  (3 ppd x 50 yrs), he states he has no intention of quitting  Pt meds include: Statin : Yes ASA: Yes Other anticoagulants/antiplatelets: Plavix   Past Medical History  Diagnosis Date  . GERD (gastroesophageal reflux disease)   . Hypertension   . Diabetes mellitus   . Barrett esophagus   . CAD (coronary artery disease)   . Hx of CABG     CABG, 2006  . PAD (peripheral artery disease)     No  aortic aneurysm 2011  . Tobacco abuse   . Carotid artery disease     Status post bilateral carotid endarterectomies  . Alcohol ingestion of more than four drinks per day     6 pack of beer per day  . Ischemic cardiomyopathy     EF 35-40%, echo, 2011  . Dyslipidemia   . COPD (chronic obstructive pulmonary disease)   . Ejection fraction     Social History History  Substance Use Topics  . Smoking status: Current Every Day Smoker -- 3.00 packs/day for 52 years    Types: Cigarettes  . Smokeless tobacco: Never Used     Comment: Patient smokes 3 packs a day  . Alcohol  Use: Yes     Comment: Patient states that he drinks a 6 pack a day    Family History Family History  Problem Relation Age of Onset  . Heart disease Father   . Hypertension Father   . Heart attack Father   . Rheum arthritis Sister   . Hypertension Sister   . Healthy Daughter   . Healthy Son   . Healthy Son   . Cancer Mother   . Hypertension Mother     Surgical History Past Surgical History  Procedure Laterality Date  . Colonoscopy    . Upper gastrointestinal endoscopy    . Hernia repair    . Cardiac surgery    . Carotid artery angioplasty      Bilateral  . Carotid endarterectomy  Jul 30, 2004    Right cea  . Carotid endarterectomy  Jan. 23, 2009    Left cea    Allergies  Allergen Reactions  . Morphine And Related     MIGRAINE    Current Outpatient Prescriptions  Medication Sig Dispense Refill  . Albuterol Sulfate (VENTOLIN HFA IN) Inhale into the lungs. As needed        . aspirin 81 MG tablet Take 81 mg by mouth daily.        . clopidogrel (PLAVIX) 75 MG tablet Take 1  tablet (75 mg total) by mouth daily.  90 tablet  3  . esomeprazole (NEXIUM) 40 MG capsule Take 1 capsule (40 mg total) by mouth daily.  90 capsule  4  . gabapentin (NEURONTIN) 600 MG tablet Take 600 mg by mouth 2 (two) times daily. At Bedtime       . lisinopril (PRINIVIL,ZESTRIL) 20 MG tablet Take 20 mg by mouth daily.      Marland Kitchen lisinopril-hydrochlorothiazide (PRINZIDE,ZESTORETIC) 20-12.5 MG per tablet Take 1 tablet by mouth daily.        . metFORMIN (GLUCOPHAGE) 500 MG tablet Take 500 mg by mouth 3 (three) times daily.       . metoprolol tartrate (LOPRESSOR) 25 MG tablet Take 0.5 tablets (12.5 mg total) by mouth 2 (two) times daily.  90 tablet  3  . simvastatin (ZOCOR) 40 MG tablet Take 40 mg by mouth daily.       Marland Kitchen tiotropium (SPIRIVA HANDIHALER) 18 MCG inhalation capsule Place 18 mcg into inhaler and inhale as needed.         No current facility-administered medications for this visit.    Review  of Systems : See HPI for pertinent positives and negatives.  Physical Examination  Filed Vitals:   05/22/13 1147  BP: 163/78  Pulse: 86  Resp: 16   Filed Weights   05/22/13 1147  Weight: 157 lb (71.215 kg)   Body mass index is 23.17 kg/(m^2).  General: WDWN male in NAD GAIT: normal Eyes: PERRLA Pulmonary:  Non-labored, CTAB, Negative  Rales, Negative rhonchi, & Negative wheezing.  Cardiac: regular Rhythm ,  Negative detected murmur.  VASCULAR EXAM Carotid Bruits Left Right   Negative Negative    Aorta is not palpable. Radial pulses are 3+ palpable and equal.                                                                                                                            LE Pulses LEFT RIGHT       POPLITEAL  not palpable   not palpable    Gastrointestinal: soft, nontender, BS WNL, no r/g,  negative masses.  Musculoskeletal: Negative muscle atrophy/wasting. M/S 5/5 throughout, Extremities without ischemic changes.  Neurologic: A&O X 3; Appropriate Affect ; SENSATION ;normal;  Speech is normal CN 2-12 intact, Pain and light touch intact in extremities, Motor exam as listed above.   Non-Invasive Vascular Imaging CAROTID DUPLEX 05/22/2013   CEREBROVASCULAR DUPLEX EVALUATION    INDICATION: Carotid endarterectomy     PREVIOUS INTERVENTION(S): Right carotid endarterectomy on 07/30/04 Left carotid endarterectomy on 04/22/07    DUPLEX EXAM:     RIGHT  LEFT  Peak Systolic Velocities (cm/s) End Diastolic Velocities (cm/s) Plaque LOCATION Peak Systolic Velocities (cm/s) End Diastolic Velocities (cm/s) Plaque  108 17  CCA PROXIMAL 93 20   82 19  CCA MID 75 16   55 12  CCA DISTAL 62 0   88 11 HT ECA 69 10  82 12  ICA PROXIMAL 50 9   70 20  ICA MID 93 23   67 20  ICA DISTAL 104 31     Not Calculated ICA / CCA Ratio (PSV) Not Calculated  Antegrade/Dampened Vertebral Flow Antegrade  177 Brachial Systolic Pressure (mmHg) 939  Multiphasic (subclavian artery)  Brachial Artery Waveforms Multiphasic (subclavian artery)    Plaque Morphology:  HM = Homogeneous, HT = Heterogeneous, CP = Calcific Plaque, SP = Smooth Plaque, IP = Irregular Plaque     ADDITIONAL FINDINGS: No significant stenosis of the bilateral external or common carotid arteries.    IMPRESSION: Patent bilateral carotid endarterectomy sites with no bilateral internal carotid artery stenoses.    Compared to the previous exam:  No significant change noted when compared to the previous exam on 05/16/12.        Assessment: Benjamin [name] is a 63 y.o. male who is status post right CEA in 2006 and left CEA in 2009, presents with asymptomatic patent bilateral carotid endarterectomy sites with no bilateral internal carotid artery stenoses. The  ICA stenosis is  Unchanged from previous exam. Unfortunately he continues to smoke 3 PPD, he was counseled re smoking cessation. Fortunately it seems his DM is in good control and his other risk factors are under maximal medical management.  Plan: Follow-up in 1 year with Carotid Duplex scan.   I discussed in depth with the patient the nature of atherosclerosis, and emphasized the importance of maximal medical management including strict control of blood pressure, blood glucose, and lipid levels, obtaining regular exercise, and cessation of smoking.  The patient is aware that without maximal medical management the underlying atherosclerotic disease process will progress, limiting the benefit of any interventions. The patient was given information about stroke prevention and what symptoms should prompt the patient to seek immediate medical care. Thank you for allowing [place] to participate in this patient's care.  Clemon Chambers, RN, MSN, FNP-C [facility] Office: [facility] Physician: Trula Slade  05/22/2013 11:37 AM

## 2013-07-01 ENCOUNTER — Other Ambulatory Visit: Payer: Self-pay | Admitting: Cardiology

## 2013-07-03 ENCOUNTER — Other Ambulatory Visit (INDEPENDENT_AMBULATORY_CARE_PROVIDER_SITE_OTHER): Payer: Self-pay | Admitting: Internal Medicine

## 2013-07-03 DIAGNOSIS — K219 Gastro-esophageal reflux disease without esophagitis: Secondary | ICD-10-CM

## 2013-07-12 ENCOUNTER — Other Ambulatory Visit: Payer: Self-pay | Admitting: *Deleted

## 2013-07-12 MED ORDER — CLOPIDOGREL BISULFATE 75 MG PO TABS: 75.0000 mg | ORAL_TABLET | Freq: Every day | ORAL | Status: DC

## 2013-09-11 ENCOUNTER — Ambulatory Visit (INDEPENDENT_AMBULATORY_CARE_PROVIDER_SITE_OTHER): Payer: 59 | Admitting: Internal Medicine

## 2013-10-16 ENCOUNTER — Other Ambulatory Visit (INDEPENDENT_AMBULATORY_CARE_PROVIDER_SITE_OTHER): Payer: Self-pay | Admitting: Internal Medicine

## 2013-10-17 ENCOUNTER — Other Ambulatory Visit (INDEPENDENT_AMBULATORY_CARE_PROVIDER_SITE_OTHER): Payer: Self-pay | Admitting: Internal Medicine

## 2013-10-17 DIAGNOSIS — K219 Gastro-esophageal reflux disease without esophagitis: Secondary | ICD-10-CM

## 2013-10-17 MED ORDER — ESOMEPRAZOLE MAGNESIUM 40 MG PO CPDR: DELAYED_RELEASE_CAPSULE | ORAL | Status: DC

## 2013-10-23 ENCOUNTER — Ambulatory Visit (INDEPENDENT_AMBULATORY_CARE_PROVIDER_SITE_OTHER): Payer: 59 | Admitting: Internal Medicine

## 2013-10-23 ENCOUNTER — Encounter (INDEPENDENT_AMBULATORY_CARE_PROVIDER_SITE_OTHER): Payer: Self-pay | Admitting: Internal Medicine

## 2013-10-23 VITALS — BP 118/74 | HR 76 | Temp 97.1°F | Resp 18 | Ht 69.0 in | Wt 153.1 lb

## 2013-10-23 DIAGNOSIS — K2270 Barrett's esophagus without dysplasia: Secondary | ICD-10-CM

## 2013-10-23 DIAGNOSIS — R634 Abnormal weight loss: Secondary | ICD-10-CM

## 2013-10-23 DIAGNOSIS — K219 Gastro-esophageal reflux disease without esophagitis: Secondary | ICD-10-CM

## 2013-10-23 DIAGNOSIS — Z86010 Personal history of colonic polyps: Secondary | ICD-10-CM

## 2013-10-23 NOTE — Progress Notes (Signed)
Presenting complaint;  Followup for GERD complicated by Barrett's.  Subjective:  Patient is 63 year old Caucasian male who presents for scheduled visit. He was last seen in November 2013. He has no complaints. He states he has no heartburn as long as he stays on his medication and watches his diet. He denies sore throat cough or hoarseness. He also denies dysphagia. He has lost 16 pounds in the last 20 months. He says his appetite has not been good. However he has not experienced nausea vomiting or diarrhea. She also denies melena or rectal bleeding. He does not do regular exercises but stays busy. He states he is getting blood work every 3 months and as far as he knows everything is normal. He continues to smoke cigarettes usually 2 packs per day. He also drinks 6 cans of beer daily.   Current Medications: Outpatient Encounter Prescriptions as of 10/23/2013  Medication Sig  . Albuterol Sulfate (VENTOLIN HFA IN) Inhale into the lungs. As needed    . aspirin 81 MG tablet Take 81 mg by mouth daily.    . clopidogrel (PLAVIX) 75 MG tablet Take 1 tablet (75 mg total) by mouth daily.  Marland Kitchen esomeprazole (NEXIUM) 40 MG capsule TAKE 1 CAPSULE DAILY  . gabapentin (NEURONTIN) 600 MG tablet Take 600 mg by mouth 2 (two) times daily. At Bedtime   . lisinopril (PRINIVIL,ZESTRIL) 20 MG tablet Take 20 mg by mouth daily.  . metFORMIN (GLUCOPHAGE) 500 MG tablet Take 500 mg by mouth 3 (three) times daily.   . metoprolol tartrate (LOPRESSOR) 25 MG tablet TAKE 1/2 TABLET TWICE A DAY  . simvastatin (ZOCOR) 40 MG tablet Take 40 mg by mouth daily.   Marland Kitchen tiotropium (SPIRIVA HANDIHALER) 18 MCG inhalation capsule Place 18 mcg into inhaler and inhale as needed.    . [DISCONTINUED] lisinopril-hydrochlorothiazide (PRINZIDE,ZESTORETIC) 20-12.5 MG per tablet Take 1 tablet by mouth daily.       Objective: Blood pressure 118/74, pulse 76, temperature 97.1 F (36.2 C), temperature source Oral, resp. rate 18, height 5\' 9"  (1.753  m), weight 153 lb 1.6 oz (69.446 kg). Patient is alert and in no acute distress. Conjunctiva is pink. Sclera is nonicteric Dentition in poor condition. Oropharyngeal mucosa is normal. No neck masses or thyromegaly noted. Cardiac exam with regular rhythm normal S1 and S2. No murmur or gallop noted. Lungs are clear to auscultation. Abdomen symmetrical soft and nontender without organomegaly or masses the  No LE edema or clubbing noted.   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 Biopsy in September 2011 was negative for dysplasia. Symptoms are well controlled with therapy. #2. History of colonic adenomas. Last colonoscopy was in September 2011. #3. Cigarette smoking. He has not able to quit cigarette smoking on his own and he therefore should review options with PCP. #4. Excessive alcohol intake without obvious sequelae. #5. Weight loss possibly due to poor intake may be related to one of his medications.  Plan:  Patient advised to cut back alcohol intake to no more than two drinks per day. Patient also advised to seek help if he is unable to quit cigarette smoking on his own. Continue Nexium at 40 mg by mouth every morning. Patient will call office for prescription renewal in October 2015. Weight check in 3 months. Office visit in one year. EGD and colonoscopy after her next office visit.

## 2013-10-23 NOTE — Patient Instructions (Addendum)
You must keep beer intake to no more than 2 cans per day.If you cannot do it on your own, discuss options with Ms. Luciana Axe, [facility] Weight check in three months.

## 2014-01-14 ENCOUNTER — Other Ambulatory Visit (INDEPENDENT_AMBULATORY_CARE_PROVIDER_SITE_OTHER): Payer: Self-pay | Admitting: Internal Medicine

## 2014-03-28 ENCOUNTER — Telehealth (INDEPENDENT_AMBULATORY_CARE_PROVIDER_SITE_OTHER): Payer: Self-pay | Admitting: *Deleted

## 2014-03-28 NOTE — Telephone Encounter (Signed)
Forwarded to Dr.Rehman. 

## 2014-03-28 NOTE — Telephone Encounter (Signed)
Dr. Laural Golden wanted Zacharias was to have his weight checked in Oct. 2015. Weight on 03/28/14 is 161.2 A 1 yr f/u was scheduled for 10/25/14 with Deberah Castle, NP.

## 2014-03-28 NOTE — Telephone Encounter (Signed)
Patient was seen by Doctor Rehman in 10/23/2013. His weight at this time was 153 lbs 1.6 oz.

## 2014-04-06 ENCOUNTER — Other Ambulatory Visit (INDEPENDENT_AMBULATORY_CARE_PROVIDER_SITE_OTHER): Payer: Self-pay | Admitting: Internal Medicine

## 2014-05-09 ENCOUNTER — Ambulatory Visit (INDEPENDENT_AMBULATORY_CARE_PROVIDER_SITE_OTHER): Payer: 59 | Admitting: Cardiology

## 2014-05-09 ENCOUNTER — Encounter: Payer: Self-pay | Admitting: Cardiology

## 2014-05-09 VITALS — BP 128/78 | HR 77 | Ht 69.0 in | Wt 163.0 lb

## 2014-05-09 DIAGNOSIS — I255 Ischemic cardiomyopathy: Secondary | ICD-10-CM

## 2014-05-09 DIAGNOSIS — I739 Peripheral vascular disease, unspecified: Secondary | ICD-10-CM

## 2014-05-09 DIAGNOSIS — I779 Disorder of arteries and arterioles, unspecified: Secondary | ICD-10-CM

## 2014-05-09 DIAGNOSIS — I25810 Atherosclerosis of coronary artery bypass graft(s) without angina pectoris: Secondary | ICD-10-CM

## 2014-05-09 DIAGNOSIS — I10 Essential (primary) hypertension: Secondary | ICD-10-CM

## 2014-05-09 NOTE — Assessment & Plan Note (Signed)
Historically his ejection fraction improved to 55%. He does not need an echo at this time.  The patient is stable. He'll be seen back in one year in our office.

## 2014-05-09 NOTE — Assessment & Plan Note (Signed)
Coronary disease is stable. No further workup. Continue his medications.

## 2014-05-09 NOTE — Progress Notes (Signed)
Cardiology Office Note   Date:  05/09/2014   ID:  TEODORO JEFFREYS, DOB 1951-02-19, MRN [id number]  PCP:  Jalene Mullet, PA-C  Cardiologist:  Dola Argyle, MD   Chief Complaint  Patient presents with  . Appointment    Follow-up coronary artery disease      History of Present Illness: Benjamin [name] is a 64 y.o. male who presents today to follow-up coronary disease. His post CABG in 2006. He has no symptoms. There was some left ventricular dysfunction in the past. He then had a follow-up echo more recently showing that he's had a good return of LV function. He continues to smoke. He and I talk about this in each year and I counseled him to stop. He is on appropriate medications. He's not having any chest pain or shortness of breath. I have not done exercise testing over the years because he's not had symptoms and he would prefer not to have any studies that are not absolutely necessary. I have been comfortable with this approach with him.    Past Medical History  Diagnosis Date  . GERD (gastroesophageal reflux disease)   . Hypertension   . Diabetes mellitus   . Barrett esophagus   . CAD (coronary artery disease)   . Hx of CABG     CABG, 2006  . PAD (peripheral artery disease)     No  aortic aneurysm 2011  . Tobacco abuse   . Carotid artery disease     Status post bilateral carotid endarterectomies  . Alcohol ingestion of more than four drinks per day     6 pack of beer per day  . Ischemic cardiomyopathy     EF 35-40%, echo, 2011  . Dyslipidemia   . COPD (chronic obstructive pulmonary disease)   . Ejection fraction     Past Surgical History  Procedure Laterality Date  . Colonoscopy    . Upper gastrointestinal endoscopy    . Hernia repair    . Cardiac surgery    . Carotid artery angioplasty      Bilateral  . Carotid endarterectomy  Jul 30, 2004    Right cea  . Carotid endarterectomy  Jan. 23, 2009    Left cea  . Coronary artery bypass graft      Dr. Clover Mealy     Patient Active Problem List   Diagnosis Date Noted  . Occlusion and stenosis of carotid artery without mention of cerebral infarction- Bilateral 05/22/2013  . Ejection fraction   . Hypertension   . CAD (coronary artery disease)   . Hx of CABG   . PAD (peripheral artery disease)   . Tobacco abuse   . Carotid artery disease   . Alcohol ingestion of more than four drinks per day   . Ischemic cardiomyopathy   . Dyslipidemia   . GERD (gastroesophageal reflux disease) 02/24/2011  . Barrett's esophagus 02/24/2011  . History of colonic polyps 02/24/2011      Current Outpatient Prescriptions  Medication Sig Dispense Refill  . Albuterol Sulfate (VENTOLIN HFA IN) Inhale into the lungs. As needed      . aspirin 81 MG tablet Take 81 mg by mouth daily.      . clopidogrel (PLAVIX) 75 MG tablet Take 1 tablet (75 mg total) by mouth daily. 90 tablet 3  . esomeprazole (NEXIUM) 40 MG capsule TAKE 1 CAPSULE DAILY       (PLEASE MAKE APPOINTMENT   WITH DOCTOR, NEED OFFICE   VISIT)  90 capsule 0  . gabapentin (NEURONTIN) 600 MG tablet Take 600 mg by mouth 2 (two) times daily. At Bedtime     . lisinopril (PRINIVIL,ZESTRIL) 20 MG tablet Take 20 mg by mouth daily.    . metFORMIN (GLUCOPHAGE) 500 MG tablet Take 500 mg by mouth 3 (three) times daily.     . metoprolol tartrate (LOPRESSOR) 25 MG tablet TAKE 1/2 TABLET TWICE A DAY 90 tablet 3  . simvastatin (ZOCOR) 40 MG tablet Take 40 mg by mouth daily.     Marland Kitchen tiotropium (SPIRIVA HANDIHALER) 18 MCG inhalation capsule Place 18 mcg into inhaler and inhale as needed.       No current facility-administered medications for this visit.    Allergies:   Morphine and related    Social History:  The patient  reports that he has been smoking Cigarettes.  He has a 156 pack-year smoking history. He has never used smokeless tobacco. He reports that he drinks alcohol. He reports that he does not use illicit drugs.   Family History:  The patient's family history  includes Cancer in his mother; Healthy in his daughter, son, and son; Heart attack in his father; Heart disease in his father; Hypertension in his father, mother, and sister; Rheum arthritis in his sister.    ROS:  Please see the history of present illness.  Patient denies fever, chills, headache, sweats, rash, change in vision, change in hearing, chest pain, , nausea or vomiting, urinary symptoms. All other systems are reviewed and are negative.      PHYSICAL EXAM: VS:  BP 128/78 mmHg  Pulse 77  Ht 5\' 9"  (1.753 m)  Wt 163 lb (73.936 kg)  BMI 24.06 kg/m2  SpO2 99% , Patient is stable. He is oriented to person time and place. Affect is normal. Head is atraumatic. Sclerae and conjunctiva are normal. There is no jugular venous distention. Lungs are clear. Respiratory effort is not labored. Cardiac exam reveals S1 and S2. Abdomen is soft. There is no peripheral edema. There are no musculoskeletal deformities. There are no skin rashes.  EKG:   EKG is done today and reviewed by me. There is sinus rhythm. There is no significant change from the past.   Recent Labs: No results found for requested labs within last 365 days.    Lipid Panel No results found for: CHOL, TRIG, HDL, CHOLHDL, VLDL, LDLCALC, LDLDIRECT    Wt Readings from Last 3 Encounters:  05/09/14 163 lb (73.936 kg)  10/23/13 153 lb 1.6 oz (69.446 kg)  05/22/13 157 lb (71.215 kg)      Current medicines are reviewed       ASSESSMENT AND PLAN:

## 2014-05-09 NOTE — Patient Instructions (Signed)

## 2014-05-09 NOTE — Assessment & Plan Note (Signed)
His carotids are followed carefully by Dr. Kellie Simmering.

## 2014-05-25 ENCOUNTER — Encounter: Payer: Self-pay | Admitting: Family

## 2014-05-28 ENCOUNTER — Ambulatory Visit: Payer: 59 | Admitting: Family

## 2014-05-28 ENCOUNTER — Ambulatory Visit (HOSPITAL_COMMUNITY)
Admission: RE | Admit: 2014-05-28 | Discharge: 2014-05-28 | Disposition: A | Discharge status: Home / Self Care | Payer: 59 | Source: Ambulatory Visit | Attending: Vascular Surgery | Admitting: Vascular Surgery

## 2014-05-28 DIAGNOSIS — Z48812 Encounter for surgical aftercare following surgery on the circulatory system: Secondary | ICD-10-CM

## 2014-05-28 DIAGNOSIS — I6523 Occlusion and stenosis of bilateral carotid arteries: Secondary | ICD-10-CM | POA: Diagnosis present

## 2014-07-09 ENCOUNTER — Telehealth (INDEPENDENT_AMBULATORY_CARE_PROVIDER_SITE_OTHER): Payer: Self-pay | Admitting: *Deleted

## 2014-07-09 NOTE — Telephone Encounter (Signed)
Needs his esomeprazole refilled . Please send to his mail order pharmacy, [facility], with a 90 day supply.

## 2014-07-11 ENCOUNTER — Other Ambulatory Visit (INDEPENDENT_AMBULATORY_CARE_PROVIDER_SITE_OTHER): Payer: Self-pay | Admitting: *Deleted

## 2014-07-11 MED ORDER — ESOMEPRAZOLE MAGNESIUM 40 MG PO CPDR: 40.0000 mg | DELAYED_RELEASE_CAPSULE | Freq: Every day | ORAL | Status: DC

## 2014-07-11 NOTE — Telephone Encounter (Signed)
A refill request has been sent to the patient's mail order pharmacy.

## 2014-07-11 NOTE — Telephone Encounter (Signed)
Patient called in and requested a refill on his Nexium ,90 day supply with 3 refills.

## 2014-08-06 ENCOUNTER — Other Ambulatory Visit: Payer: Self-pay | Admitting: Cardiology

## 2014-08-06 MED ORDER — METOPROLOL TARTRATE 25 MG PO TABS: 12.5000 mg | ORAL_TABLET | Freq: Two times a day (BID) | ORAL | Status: DC

## 2014-08-06 NOTE — Telephone Encounter (Signed)
clopidogrel (PLAVIX) 75 MG tablet  Medication     metoprolol tartrate (LOPRESSOR) 25 MG tablet   [facility]

## 2014-08-10 ENCOUNTER — Other Ambulatory Visit: Payer: Self-pay | Admitting: Cardiology

## 2014-08-10 MED ORDER — CLOPIDOGREL BISULFATE 75 MG PO TABS: 75.0000 mg | ORAL_TABLET | Freq: Every day | ORAL | Status: DC

## 2014-08-10 NOTE — Telephone Encounter (Signed)
Needs refill on clopidogrel (PLAVIX) 75 MG tablet   Send to [facility] 90 day supply

## 2014-10-25 ENCOUNTER — Encounter (INDEPENDENT_AMBULATORY_CARE_PROVIDER_SITE_OTHER): Payer: Self-pay | Admitting: Internal Medicine

## 2014-10-25 ENCOUNTER — Ambulatory Visit (INDEPENDENT_AMBULATORY_CARE_PROVIDER_SITE_OTHER): Payer: 59 | Admitting: Internal Medicine

## 2014-10-25 VITALS — BP 122/60 | HR 68 | Temp 98.0°F | Ht 69.0 in | Wt 157.0 lb

## 2014-10-25 DIAGNOSIS — K2270 Barrett's esophagus without dysplasia: Secondary | ICD-10-CM

## 2014-10-25 DIAGNOSIS — K219 Gastro-esophageal reflux disease without esophagitis: Secondary | ICD-10-CM

## 2014-10-25 NOTE — Patient Instructions (Signed)
OV in 1 year. Continue the Nexium. Let us know when he will schedule EGD/Colonoscopy

## 2014-10-25 NOTE — Progress Notes (Signed)
Subjective:    Patient ID: Benjamin [name], male    DOB: 09-Sep-1950, 63 y.o.   MRN: [id number]  HPI Here today for f/u. He was last seen by Dr. Laural Golden in July of 2015. Hx of GERD and Barrett's He tells me his acid reflux is controlled with Nexium. He tries to watch what he eats.  No nausea, vomiting of diarrhea. He has gained 4 pounds since his last visit.  He continues to smoke 2 packs a day. He drinks 6 pack of beer daily.  He usually has a BM daily. Hx of CABG and is maintained on Plavix. He is not sure if he wants to undergo another EGD/Colonoscopy.  His last EGD/Colonoscopy was in 2011.  Hx of colon polyps. Hx of Barrett's esophagus  Short segment Barrett's esophagus. No evidence of erosive esophagitis. A  2cm size sliding hiatal hernia.  Normal colonoscopy except for external hemorrhoids. Esophagus Biopsy: GE junction, No significant abnormality identified. Barrett's esophagus not identified.       Review of Systems Past Medical History  Diagnosis Date  . GERD (gastroesophageal reflux disease)   . Hypertension   . Diabetes mellitus   . Barrett esophagus   . CAD (coronary artery disease)   . Hx of CABG     CABG, 2006  . PAD (peripheral artery disease)     No  aortic aneurysm 2011  . Tobacco abuse   . Carotid artery disease     Status post bilateral carotid endarterectomies  . Alcohol ingestion of more than four drinks per day     6 pack of beer per day  . Ischemic cardiomyopathy     EF 35-40%, echo, 2011  . Dyslipidemia   . COPD (chronic obstructive pulmonary disease)   . Ejection fraction     Past Surgical History  Procedure Laterality Date  . Colonoscopy    . Upper gastrointestinal endoscopy    . Hernia repair    . Cardiac surgery    . Carotid artery angioplasty      Bilateral  . Carotid endarterectomy  Jul 30, 2004    Right cea  . Carotid endarterectomy  Jan. 23, 2009    Left cea  . Coronary artery bypass graft      Dr. Clover Mealy    Allergies    Allergen Reactions  . Morphine And Related     MIGRAINE    Current Outpatient Prescriptions on File Prior to Visit  Medication Sig Dispense Refill  . Albuterol Sulfate (VENTOLIN HFA IN) Inhale into the lungs. As needed      . aspirin 81 MG tablet Take 81 mg by mouth daily.      . clopidogrel (PLAVIX) 75 MG tablet Take 1 tablet (75 mg total) by mouth daily. 90 tablet 3  . esomeprazole (NEXIUM) 40 MG capsule Take 1 capsule (40 mg total) by mouth daily. 90 capsule 3  . gabapentin (NEURONTIN) 600 MG tablet Take 600 mg by mouth 2 (two) times daily. At Bedtime     . lisinopril (PRINIVIL,ZESTRIL) 20 MG tablet Take 20 mg by mouth daily.    . metFORMIN (GLUCOPHAGE) 500 MG tablet Take 500 mg by mouth 3 (three) times daily.     . metoprolol tartrate (LOPRESSOR) 25 MG tablet Take 0.5 tablets (12.5 mg total) by mouth 2 (two) times daily. 90 tablet 3  . simvastatin (ZOCOR) 40 MG tablet Take 40 mg by mouth daily.     Marland Kitchen tiotropium (SPIRIVA HANDIHALER) 18 MCG inhalation  capsule Place 18 mcg into inhaler and inhale as needed.       No current facility-administered medications on file prior to visit.        Objective:   Physical Exam Blood pressure 122/60, pulse 68, temperature 98 F (36.7 C), height 5\' 9"  (1.753 m), weight 157 lb (71.215 kg). Alert and oriented. Skin warm and dry. Oral mucosa is moist.   . Sclera anicteric, conjunctivae is pink. Thyroid not enlarged. No cervical lymphadenopathy. Lungs clear. Heart regular rate and rhythm.  Abdomen is soft. Bowel sounds are positive. No hepatomegaly. No abdominal masses felt. No tenderness.  No edema to lower extremities.         Assessment & Plan:  GERD controlled with Nexium. Barrett's esophagus. Last EGD Barrett's was not identified. Due to surveillance EGD.  Hx of colonic polyps. Due for a surveillance colonoscopy. Patient would like to put this on hold, due to not having a driver. He will call and let us know.

## 2015-05-14 ENCOUNTER — Ambulatory Visit (INDEPENDENT_AMBULATORY_CARE_PROVIDER_SITE_OTHER): Payer: 59 | Admitting: Cardiovascular Disease

## 2015-05-14 ENCOUNTER — Encounter: Payer: Self-pay | Admitting: Cardiovascular Disease

## 2015-05-14 VITALS — BP 129/68 | HR 75 | Ht 69.0 in | Wt 158.0 lb

## 2015-05-14 DIAGNOSIS — I6523 Occlusion and stenosis of bilateral carotid arteries: Secondary | ICD-10-CM

## 2015-05-14 DIAGNOSIS — I10 Essential (primary) hypertension: Secondary | ICD-10-CM

## 2015-05-14 DIAGNOSIS — I25810 Atherosclerosis of coronary artery bypass graft(s) without angina pectoris: Secondary | ICD-10-CM | POA: Diagnosis not present

## 2015-05-14 DIAGNOSIS — E785 Hyperlipidemia, unspecified: Secondary | ICD-10-CM | POA: Diagnosis not present

## 2015-05-14 NOTE — Patient Instructions (Signed)
   Stop Plavix. Continue all other medications.   Your physician wants you to follow up in:  1 year.  You will receive a reminder letter in the mail one-two months in advance.  If you don't receive a letter, please call our office to schedule the follow up appointment   

## 2015-05-14 NOTE — Progress Notes (Signed)
Patient ID: Benjamin [name], male   DOB: [birth year], 65 y.o.   MRN: [id number]      SUBJECTIVE: The patient is a 65 year old male who I am meeting for the first time. He is a former patient of Dr. Ron Parker. He has a history of coronary artery disease and CABG in 2006. He also has a history of hypertension, dyslipidemia, and tobacco abuse. Echocardiogram on 05/11/12 demonstrated normal left ventricular systolic function and regional wall motion, EF 55%. Diastolic dysfunction was present. He has a history of bilateral carotid endarterectomies with Dopplers on 05/28/14 demonstrating less than 40% stenosis bilaterally.  The patient denies any symptoms of chest pain, palpitations, shortness of breath, lightheadedness, dizziness, leg swelling, orthopnea, PND, and syncope. 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  ECG performed in the office today demonstrates normal sinus rhythm with no ischemic ST segment or T-wave abnormalities, nor any arrhythmias.   Review of Systems: As per "subjective", otherwise negative.  Allergies  Allergen Reactions  . Morphine And Related     MIGRAINE    Current Outpatient Prescriptions  Medication Sig Dispense Refill  . Albuterol Sulfate (VENTOLIN HFA IN) Inhale into the lungs. As needed      . aspirin 81 MG tablet Take 81 mg by mouth daily.      . clopidogrel (PLAVIX) 75 MG tablet Take 1 tablet (75 mg total) by mouth daily. 90 tablet 3  . esomeprazole (NEXIUM) 40 MG capsule Take 1 capsule (40 mg total) by mouth daily. 90 capsule 3  . gabapentin (NEURONTIN) 600 MG tablet Take 600 mg by mouth 2 (two) times daily. At Bedtime     . lisinopril (PRINIVIL,ZESTRIL) 20 MG tablet Take 20 mg by mouth daily.    . metFORMIN (GLUCOPHAGE) 500 MG tablet Take 500 mg by mouth 3 (three) times daily.     . metoprolol tartrate (LOPRESSOR) 25 MG tablet Take 0.5 tablets (12.5 mg total) by mouth 2 (two) times daily. 90 tablet 3  . simvastatin (ZOCOR) 40 MG tablet Take 40 mg  by mouth daily.     Marland Kitchen tiotropium (SPIRIVA HANDIHALER) 18 MCG inhalation capsule Place 18 mcg into inhaler and inhale as needed.       No current facility-administered medications for this visit.    Past Medical History  Diagnosis Date  . GERD (gastroesophageal reflux disease)   . Hypertension   . Diabetes mellitus   . Barrett esophagus   . CAD (coronary artery disease)   . Hx of CABG     CABG, 2006  . PAD (peripheral artery disease) ([place])     No  aortic aneurysm 2011  . Tobacco abuse   . Carotid artery disease ([place])     Status post bilateral carotid endarterectomies  . Alcohol ingestion of more than four drinks per day     6 pack of beer per day  . Ischemic cardiomyopathy     EF 35-40%, echo, 2011  . Dyslipidemia   . COPD (chronic obstructive pulmonary disease) ([place])   . Ejection fraction     Past Surgical History  Procedure Laterality Date  . Colonoscopy    . Upper gastrointestinal endoscopy    . Hernia repair    . Cardiac surgery    . Carotid artery angioplasty      Bilateral  . Carotid endarterectomy  Jul 30, 2004    Right cea  . Carotid endarterectomy  Jan. 23, 2009    Left cea  . Coronary artery bypass graft  Dr. Clover Mealy    Social History   Social History  . Marital Status: Married    Spouse Name: N/A  . Number of Children: N/A  . Years of Education: N/A   Occupational History  . Not on file.   Social History Main Topics  . Smoking status: Current Every Day Smoker -- 3.00 packs/day for 52 years    Types: Cigarettes    Start date: 03/05/1958  . Smokeless tobacco: Never Used     Comment: Patient smokes 3 packs a day  . Alcohol Use: 0.0 oz/week    0 Standard drinks or equivalent per week     Comment: Patient states that he drinks a 6 pack a day  . Drug Use: No  . Sexual Activity: Not on file   Other Topics Concern  . Not on file   Social History Narrative     Filed Vitals:   05/14/15 0853  BP: 129/68  Pulse: 75  Height: 5\' 9"  (1.753  m)  Weight: 158 lb (71.668 kg)    PHYSICAL EXAM General: NAD HEENT: Normal. Neck: No JVD, no thyromegaly. Lungs: Clear to auscultation bilaterally with normal respiratory effort. CV: Nondisplaced PMI.  Regular rate and rhythm, normal S1/S2, no S3/S4, no murmur. No pretibial or periankle edema.  No carotid bruit.   Abdomen: Soft, nontender, no distention.  Neurologic: Alert and oriented.  Psych: Normal affect. Skin: Normal. Musculoskeletal: No gross deformities.  ECG: Most recent ECG reviewed.      ASSESSMENT AND PLAN: 1. CAD with CABG (2006): Symptomatically stable. Will stop Plavix and continue ASA, metoprolol, and simvastatin.  2. Essential HTN: Controlled. No changes.  3. Hyperlipidemia: Continue simvastatin.  4. PVD with h/o bilateral carotid endarterectomies: Continue ASA and statin. Dopplers reviewed above. Follows with vascular surgery.  Dispo: f/u 1 year.   Kate Sable, M.D., F.A.C.C.

## 2015-06-25 ENCOUNTER — Other Ambulatory Visit (INDEPENDENT_AMBULATORY_CARE_PROVIDER_SITE_OTHER): Payer: Self-pay | Admitting: Internal Medicine

## 2015-07-26 ENCOUNTER — Encounter (INDEPENDENT_AMBULATORY_CARE_PROVIDER_SITE_OTHER): Payer: Self-pay | Admitting: *Deleted

## 2015-10-23 ENCOUNTER — Ambulatory Visit (INDEPENDENT_AMBULATORY_CARE_PROVIDER_SITE_OTHER): Payer: 59 | Admitting: Internal Medicine

## 2015-10-23 ENCOUNTER — Encounter (INDEPENDENT_AMBULATORY_CARE_PROVIDER_SITE_OTHER): Payer: Self-pay | Admitting: Internal Medicine

## 2015-10-23 VITALS — BP 130/62 | HR 72 | Temp 97.9°F | Ht 69.0 in | Wt 149.3 lb

## 2015-10-23 DIAGNOSIS — K2270 Barrett's esophagus without dysplasia: Secondary | ICD-10-CM

## 2015-10-23 DIAGNOSIS — K219 Gastro-esophageal reflux disease without esophagitis: Secondary | ICD-10-CM | POA: Diagnosis not present

## 2015-10-23 DIAGNOSIS — Z86010 Personal history of colonic polyps: Secondary | ICD-10-CM

## 2015-10-23 NOTE — Progress Notes (Signed)
Subjective:    Patient ID: Benjamin [name], male    DOB: Mar 08, 1951, 65 y.o.   MRN: [id number]  HPI Here today for f/u. He was last seen in July of 2016. Hx of GERD and Barrett's esophagus. Acid reflux is controlled with the Nexium. He eats what he wants. He occasionally eat spicy foods. No nausea or vomiting.  He continues to smoke 3 packs of cigarettes a day.  He drinks a 6 pack a day. Hx of CABG in the past. Recently taken off Plavix in February.  Appetite is good. He has lost about 7 pounds since his last visit.  His BMs are normal. He does occasionally have some diarrhea. No melena or BRRB.        His last EGD/Colonoscopy was in 2011.  Hx of colon polyps. Hx of Barrett's esophagus  Short segment Barrett's esophagus. No evidence of erosive esophagitis. A  2cm size sliding hiatal hernia.  Normal colonoscopy except for external hemorrhoids. Esophagus Biopsy: GE junction, No significant abnormality identified. Barrett's esophagus not identified.      Review of Systems   Past Medical History:  Diagnosis Date  . Alcohol ingestion of more than four drinks per day    6 pack of beer per day  . Barrett esophagus   . CAD (coronary artery disease)   . Carotid artery disease ([place])    Status post bilateral carotid endarterectomies  . COPD (chronic obstructive pulmonary disease) ([place])   . Diabetes mellitus   . Dyslipidemia   . Ejection fraction   . GERD (gastroesophageal reflux disease)   . Hx of CABG    CABG, 2006  . Hypertension   . Ischemic cardiomyopathy    EF 35-40%, echo, 2011  . PAD (peripheral artery disease) ([place])    No  aortic aneurysm 2011  . Tobacco abuse     Past Surgical History:  Procedure Laterality Date  . CARDIAC SURGERY    . CAROTID ARTERY ANGIOPLASTY     Bilateral  . CAROTID ENDARTERECTOMY  Jul 30, 2004   Right cea  . CAROTID ENDARTERECTOMY  Jan. 23, 2009   Left cea  . COLONOSCOPY    . CORONARY ARTERY BYPASS GRAFT     Dr. Clover Mealy  . HERNIA  REPAIR    . UPPER GASTROINTESTINAL ENDOSCOPY      Allergies  Allergen Reactions  . Morphine And Related     MIGRAINE    Current Outpatient Prescriptions on File Prior to Visit  Medication Sig Dispense Refill  . Albuterol Sulfate (VENTOLIN HFA IN) Inhale into the lungs. As needed      . aspirin 81 MG tablet Take 81 mg by mouth daily.      Marland Kitchen esomeprazole (NEXIUM) 40 MG capsule TAKE 1 CAPSULE DAILY 90 capsule 3  . gabapentin (NEURONTIN) 600 MG tablet Take 600 mg by mouth 2 (two) times daily. At Bedtime     . lisinopril (PRINIVIL,ZESTRIL) 20 MG tablet Take 20 mg by mouth daily.    . metFORMIN (GLUCOPHAGE) 500 MG tablet Take 500 mg by mouth 3 (three) times daily.     . metoprolol tartrate (LOPRESSOR) 25 MG tablet Take 0.5 tablets (12.5 mg total) by mouth 2 (two) times daily. 90 tablet 3  . simvastatin (ZOCOR) 40 MG tablet Take 40 mg by mouth daily.     Marland Kitchen tiotropium (SPIRIVA HANDIHALER) 18 MCG inhalation capsule Place 18 mcg into inhaler and inhale as needed.       No current facility-administered  medications on file prior to visit.        Objective:   Physical Exam Blood pressure 130/62, pulse 72, temperature 97.9 F (36.6 C), height 5\' 9"  (1.753 m), weight 149 lb 4.8 oz (67.7 kg).  Alert and oriented. Skin warm and dry. Oral mucosa is moist.   . Sclera anicteric, conjunctivae is pink. Thyroid not enlarged. No cervical lymphadenopathy. Lungs clear. Heart regular rate and rhythm.  Abdomen is soft. Bowel sounds are positive. No hepatomegaly. No abdominal masses felt. No tenderness.  No edema to lower extremities.         Assessment & Plan:  Hx of Barrett's. Needs surveillance Hx of colonic adenoma polyps. Last colonoscopy in 2011. Needs surveillance. He has declined today. He will call back and schedule procedures at a later date. Marland Kitchen

## 2015-10-23 NOTE — Patient Instructions (Signed)
OV in 1 year. Continue the Nexium. Call when you are ready for the Colnooscopy/EGD.

## 2015-11-18 ENCOUNTER — Other Ambulatory Visit: Payer: Self-pay | Admitting: *Deleted

## 2015-11-18 MED ORDER — METOPROLOL TARTRATE 25 MG PO TABS: 12.5000 mg | ORAL_TABLET | Freq: Two times a day (BID) | ORAL | 3 refills | Status: DC

## 2016-05-01 ENCOUNTER — Encounter: Payer: Self-pay | Admitting: Internal Medicine

## 2016-07-27 ENCOUNTER — Encounter (INDEPENDENT_AMBULATORY_CARE_PROVIDER_SITE_OTHER): Payer: Self-pay | Admitting: Internal Medicine

## 2016-07-27 ENCOUNTER — Encounter (INDEPENDENT_AMBULATORY_CARE_PROVIDER_SITE_OTHER): Payer: Self-pay

## 2016-08-13 ENCOUNTER — Encounter (INDEPENDENT_AMBULATORY_CARE_PROVIDER_SITE_OTHER): Payer: Self-pay | Admitting: Internal Medicine

## 2016-08-13 ENCOUNTER — Telehealth (INDEPENDENT_AMBULATORY_CARE_PROVIDER_SITE_OTHER): Payer: Self-pay | Admitting: *Deleted

## 2016-08-13 ENCOUNTER — Ambulatory Visit (INDEPENDENT_AMBULATORY_CARE_PROVIDER_SITE_OTHER): Payer: Medicare PPO | Admitting: Internal Medicine

## 2016-08-13 ENCOUNTER — Other Ambulatory Visit (INDEPENDENT_AMBULATORY_CARE_PROVIDER_SITE_OTHER): Payer: Self-pay | Admitting: Internal Medicine

## 2016-08-13 ENCOUNTER — Encounter (INDEPENDENT_AMBULATORY_CARE_PROVIDER_SITE_OTHER): Payer: Self-pay

## 2016-08-13 ENCOUNTER — Encounter (INDEPENDENT_AMBULATORY_CARE_PROVIDER_SITE_OTHER): Payer: Self-pay | Admitting: *Deleted

## 2016-08-13 VITALS — BP 110/52 | HR 60 | Temp 97.4°F | Ht 69.0 in | Wt 136.5 lb

## 2016-08-13 DIAGNOSIS — K2270 Barrett's esophagus without dysplasia: Secondary | ICD-10-CM

## 2016-08-13 DIAGNOSIS — D508 Other iron deficiency anemias: Secondary | ICD-10-CM | POA: Diagnosis not present

## 2016-08-13 DIAGNOSIS — Z86010 Personal history of colonic polyps: Secondary | ICD-10-CM | POA: Diagnosis not present

## 2016-08-13 DIAGNOSIS — K219 Gastro-esophageal reflux disease without esophagitis: Secondary | ICD-10-CM

## 2016-08-13 LAB — CBC WITH DIFFERENTIAL/PLATELET
Basophils Absolute: 99 cells/uL (ref 0–200)
Basophils Relative: 1 %
EOS ABS: 99 {cells}/uL (ref 15–500)
Eosinophils Relative: 1 %
HEMATOCRIT: 43.3 % (ref 38.5–50.0)
HEMOGLOBIN: 14 g/dL (ref 13.2–17.1)
Lymphocytes Relative: 18 %
Lymphs Abs: 1782 cells/uL (ref 850–3900)
MCH: 23.8 pg — ABNORMAL LOW (ref 27.0–33.0)
MCHC: 32.3 g/dL (ref 32.0–36.0)
MCV: 73.6 fL — AB (ref 80.0–100.0)
MONO ABS: 990 {cells}/uL — AB (ref 200–950)
MPV: 8.9 fL (ref 7.5–12.5)
Monocytes Relative: 10 %
NEUTROS ABS: 6930 {cells}/uL (ref 1500–7800)
Neutrophils Relative %: 70 %
Platelets: 319 10*3/uL (ref 140–400)
RBC: 5.88 MIL/uL — ABNORMAL HIGH (ref 4.20–5.80)
RDW: 22.1 % — ABNORMAL HIGH (ref 11.0–15.0)
WBC: 9.9 10*3/uL (ref 3.8–10.8)

## 2016-08-13 LAB — FERRITIN: Ferritin: 51 ng/mL (ref 20–380)

## 2016-08-13 MED ORDER — PEG 3350-KCL-NA BICARB-NACL 420 G PO SOLR: 4000.0000 mL | Freq: Once | ORAL | 0 refills | Status: AC

## 2016-08-13 NOTE — Telephone Encounter (Signed)
Spoke to [employer], ok to change to generic per Karna Christmas

## 2016-08-13 NOTE — Patient Instructions (Addendum)
EGD/Colonoscopy. The risks and benefits such as perforation, bleeding, and infection were reviewed with the patient and is agreeable. CBC and ferritin today.

## 2016-08-13 NOTE — Progress Notes (Signed)
Subjective:    Patient ID: Benjamin [name], male    DOB: [birth year], 66 y.o.   MRN: [id number]  HPI  Here today for f/u. Last seen in July of 2017. Hx of GERD and Barrett's . Acid reflux is controlled with Zantac. Nexium was discontinued due to his insurance.  Hx of CABG. Maintained on ASA at this time.  Was told a [facility] he was anemic and he is on iron. Appetite has remained good.  In July of 2017 his weight was 149. Today his weight is 136.5. Has 2-3 stools a day. Stools are black from iron he states.  No abdominal pain.  Smokes 3 packs of cigarettes a day.   06/15/2016 H and H 12.0 and 40.5, MCV 75   His last EGD/Colonoscopy was in 2011. Hx of colon polyps. Hx of Barrett's esophagus Short segment Barrett's esophagus. No evidence of erosive esophagitis. A 2cm size sliding hiatal hernia.  Normal colonoscopy except for external hemorrhoids. Esophagus Biopsy: GE junction, No significant abnormality identified. Barrett's esophagus not identified.     Review of Systems Past Medical History:  Diagnosis Date  . Alcohol ingestion of more than four drinks per day    6 pack of beer per day  . Barrett esophagus   . CAD (coronary artery disease)   . Carotid artery disease ([place])    Status post bilateral carotid endarterectomies  . COPD (chronic obstructive pulmonary disease) ([place])   . Diabetes mellitus   . Dyslipidemia   . Ejection fraction   . GERD (gastroesophageal reflux disease)   . Hx of CABG    CABG, 2006  . Hypertension   . Ischemic cardiomyopathy    EF 35-40%, echo, 2011  . PAD (peripheral artery disease) ([place])    No  aortic aneurysm 2011  . Tobacco abuse     Past Surgical History:  Procedure Laterality Date  . CARDIAC SURGERY    . CAROTID ARTERY ANGIOPLASTY     Bilateral  . CAROTID ENDARTERECTOMY  Jul 30, 2004   Right cea  . CAROTID ENDARTERECTOMY  Jan. 23, 2009   Left cea  . COLONOSCOPY    . CORONARY ARTERY BYPASS GRAFT     Dr. Clover Mealy  . HERNIA  REPAIR    . UPPER GASTROINTESTINAL ENDOSCOPY      Allergies  Allergen Reactions  . Morphine And Related     MIGRAINE    Current Outpatient Prescriptions on File Prior to Visit  Medication Sig Dispense Refill  . Albuterol Sulfate (VENTOLIN HFA IN) Inhale into the lungs. As needed      . aspirin 81 MG tablet Take 81 mg by mouth daily.      Marland Kitchen lisinopril (PRINIVIL,ZESTRIL) 20 MG tablet Take 20 mg by mouth daily.    . metFORMIN (GLUCOPHAGE) 500 MG tablet Take 500 mg by mouth 3 (three) times daily.     . metoprolol tartrate (LOPRESSOR) 25 MG tablet Take 0.5 tablets (12.5 mg total) by mouth 2 (two) times daily. 90 tablet 3  . simvastatin (ZOCOR) 40 MG tablet Take 40 mg by mouth daily.     Marland Kitchen esomeprazole (NEXIUM) 40 MG capsule TAKE 1 CAPSULE DAILY (Patient not taking: Reported on 08/13/2016) 90 capsule 3  . gabapentin (NEURONTIN) 600 MG tablet Take 600 mg by mouth 2 (two) times daily. At Bedtime      No current facility-administered medications on file prior to visit.        Objective:   Physical Exam Blood pressure Marland Kitchen)  110/52, pulse 60, temperature 97.4 F (36.3 C), height 5\' 9"  (1.753 m), weight 136 lb 8 oz (61.9 kg). Alert and oriented. Skin warm and dry. Oral mucosa is moist.   . Sclera anicteric, conjunctivae is pink. Thyroid not enlarged. No cervical lymphadenopathy. Lungs clear. Heart regular rate and rhythm.  Abdomen is soft. Bowel sounds are positive. No hepatomegaly. No abdominal masses felt. No tenderness.  No edema to lower extremities.  Stool black and guaiac negative.          Assessment & Plan:  Barrett's esophagus. Needs surveillance. Hx of colonic adenoma. Last colonoscopy in 2011. Needs surveillance EGD/Colonoscopy. The risks and benefits such as perforation, bleeding, and infection were reviewed with the patient and is agreeable IDA: CBC, ferritin  today. Will get labs from [place] and [place]. GERD: Continue the Zantac. Marland Kitchen

## 2016-08-13 NOTE — Telephone Encounter (Signed)
Patient needs trilyte 

## 2016-08-13 NOTE — Telephone Encounter (Signed)
Forwarded to Ann. 

## 2016-08-13 NOTE — Telephone Encounter (Signed)
Benjamin [name] called left message from [facility] in [place] about the go-lightly she would like to change it to a generic. Please call Benjamin [name] at [phone]

## 2016-09-17 ENCOUNTER — Encounter (INDEPENDENT_AMBULATORY_CARE_PROVIDER_SITE_OTHER): Payer: Self-pay | Admitting: Internal Medicine

## 2016-10-21 ENCOUNTER — Ambulatory Visit (INDEPENDENT_AMBULATORY_CARE_PROVIDER_SITE_OTHER): Payer: Medicare PPO | Admitting: Internal Medicine

## 2016-11-12 ENCOUNTER — Encounter (HOSPITAL_COMMUNITY): Payer: Self-pay

## 2016-11-12 ENCOUNTER — Ambulatory Visit (HOSPITAL_COMMUNITY)
Admission: RE | Admit: 2016-11-12 | Discharge: 2016-11-12 | Disposition: A | Discharge status: Home / Self Care | Payer: Medicare PPO | Source: Ambulatory Visit | Attending: Internal Medicine | Admitting: Internal Medicine

## 2016-11-12 ENCOUNTER — Encounter (HOSPITAL_COMMUNITY)
Admission: RE | Disposition: A | Discharge status: Home / Self Care | Payer: Self-pay | Source: Ambulatory Visit | Attending: Internal Medicine

## 2016-11-12 DIAGNOSIS — I10 Essential (primary) hypertension: Secondary | ICD-10-CM | POA: Insufficient documentation

## 2016-11-12 DIAGNOSIS — K269 Duodenal ulcer, unspecified as acute or chronic, without hemorrhage or perforation: Secondary | ICD-10-CM | POA: Insufficient documentation

## 2016-11-12 DIAGNOSIS — E119 Type 2 diabetes mellitus without complications: Secondary | ICD-10-CM | POA: Diagnosis not present

## 2016-11-12 DIAGNOSIS — D509 Iron deficiency anemia, unspecified: Secondary | ICD-10-CM | POA: Insufficient documentation

## 2016-11-12 DIAGNOSIS — K219 Gastro-esophageal reflux disease without esophagitis: Secondary | ICD-10-CM | POA: Insufficient documentation

## 2016-11-12 DIAGNOSIS — E785 Hyperlipidemia, unspecified: Secondary | ICD-10-CM | POA: Insufficient documentation

## 2016-11-12 DIAGNOSIS — K766 Portal hypertension: Secondary | ICD-10-CM | POA: Diagnosis not present

## 2016-11-12 DIAGNOSIS — Z1211 Encounter for screening for malignant neoplasm of colon: Secondary | ICD-10-CM | POA: Diagnosis not present

## 2016-11-12 DIAGNOSIS — K228 Other specified diseases of esophagus: Secondary | ICD-10-CM

## 2016-11-12 DIAGNOSIS — Z885 Allergy status to narcotic agent status: Secondary | ICD-10-CM | POA: Diagnosis not present

## 2016-11-12 DIAGNOSIS — K644 Residual hemorrhoidal skin tags: Secondary | ICD-10-CM | POA: Diagnosis not present

## 2016-11-12 DIAGNOSIS — K2270 Barrett's esophagus without dysplasia: Secondary | ICD-10-CM | POA: Insufficient documentation

## 2016-11-12 DIAGNOSIS — Z79899 Other long term (current) drug therapy: Secondary | ICD-10-CM | POA: Insufficient documentation

## 2016-11-12 DIAGNOSIS — I255 Ischemic cardiomyopathy: Secondary | ICD-10-CM | POA: Insufficient documentation

## 2016-11-12 DIAGNOSIS — K210 Gastro-esophageal reflux disease with esophagitis: Secondary | ICD-10-CM | POA: Insufficient documentation

## 2016-11-12 DIAGNOSIS — Z09 Encounter for follow-up examination after completed treatment for conditions other than malignant neoplasm: Secondary | ICD-10-CM | POA: Diagnosis not present

## 2016-11-12 DIAGNOSIS — K319 Disease of stomach and duodenum, unspecified: Secondary | ICD-10-CM | POA: Insufficient documentation

## 2016-11-12 DIAGNOSIS — K2971 Gastritis, unspecified, with bleeding: Secondary | ICD-10-CM

## 2016-11-12 DIAGNOSIS — I739 Peripheral vascular disease, unspecified: Secondary | ICD-10-CM | POA: Diagnosis not present

## 2016-11-12 DIAGNOSIS — J449 Chronic obstructive pulmonary disease, unspecified: Secondary | ICD-10-CM | POA: Diagnosis not present

## 2016-11-12 DIAGNOSIS — Z7984 Long term (current) use of oral hypoglycemic drugs: Secondary | ICD-10-CM | POA: Insufficient documentation

## 2016-11-12 DIAGNOSIS — F17210 Nicotine dependence, cigarettes, uncomplicated: Secondary | ICD-10-CM | POA: Insufficient documentation

## 2016-11-12 DIAGNOSIS — Z860100 Personal history of colon polyps, unspecified: Secondary | ICD-10-CM

## 2016-11-12 DIAGNOSIS — I2510 Atherosclerotic heart disease of native coronary artery without angina pectoris: Secondary | ICD-10-CM | POA: Diagnosis not present

## 2016-11-12 DIAGNOSIS — K3189 Other diseases of stomach and duodenum: Secondary | ICD-10-CM | POA: Diagnosis not present

## 2016-11-12 DIAGNOSIS — K5730 Diverticulosis of large intestine without perforation or abscess without bleeding: Secondary | ICD-10-CM | POA: Insufficient documentation

## 2016-11-12 DIAGNOSIS — Z951 Presence of aortocoronary bypass graft: Secondary | ICD-10-CM | POA: Diagnosis not present

## 2016-11-12 DIAGNOSIS — Z800 Family history of malignant neoplasm of digestive organs: Secondary | ICD-10-CM | POA: Insufficient documentation

## 2016-11-12 DIAGNOSIS — Z7982 Long term (current) use of aspirin: Secondary | ICD-10-CM | POA: Insufficient documentation

## 2016-11-12 HISTORY — PX: COLONOSCOPY: SHX5424

## 2016-11-12 HISTORY — PX: ESOPHAGOGASTRODUODENOSCOPY: SHX5428

## 2016-11-12 HISTORY — PX: BIOPSY: SHX5522

## 2016-11-12 LAB — GLUCOSE, CAPILLARY: Glucose-Capillary: 84 mg/dL (ref 65–99)

## 2016-11-12 SURGERY — EGD (ESOPHAGOGASTRODUODENOSCOPY)
Anesthesia: Moderate Sedation

## 2016-11-12 MED ORDER — MIDAZOLAM HCL 5 MG/5ML IJ SOLN
INTRAMUSCULAR | Status: DC | PRN
  Administered 2016-11-12: 2 mg via INTRAVENOUS
  Administered 2016-11-12: 1 mg via INTRAVENOUS
  Administered 2016-11-12: 2 mg via INTRAVENOUS
  Administered 2016-11-12: 1 mg via INTRAVENOUS
  Administered 2016-11-12: 2 mg via INTRAVENOUS

## 2016-11-12 MED ORDER — SODIUM CHLORIDE 0.9 % IV SOLN
INTRAVENOUS | Status: DC
  Administered 2016-11-12: 12:00:00 via INTRAVENOUS

## 2016-11-12 MED ORDER — MEPERIDINE HCL 50 MG/ML IJ SOLN
INTRAMUSCULAR | Status: DC | PRN
  Administered 2016-11-12 (×2): 25 mg via INTRAVENOUS

## 2016-11-12 MED ORDER — LIDOCAINE VISCOUS 2 % MT SOLN
OROMUCOSAL | Status: AC
  Filled 2016-11-12: qty 15

## 2016-11-12 MED ORDER — MEPERIDINE HCL 50 MG/ML IJ SOLN
INTRAMUSCULAR | Status: AC
  Filled 2016-11-12: qty 1

## 2016-11-12 MED ORDER — MIDAZOLAM HCL 5 MG/5ML IJ SOLN
INTRAMUSCULAR | Status: AC
  Filled 2016-11-12: qty 10

## 2016-11-12 MED ORDER — LIDOCAINE VISCOUS 2 % MT SOLN
OROMUCOSAL | Status: DC | PRN
Start: 2016-11-12 — End: 2016-11-12
  Administered 2016-11-12: 4 mL via OROMUCOSAL

## 2016-11-12 MED ORDER — SIMETHICONE 40 MG/0.6ML PO SUSP
ORAL | Status: DC | PRN
  Administered 2016-11-12: 13:00:00

## 2016-11-12 NOTE — Discharge Instructions (Signed)
Resume aspirin on 11/13/2016. Take Nexium OTC 30 minutes before breakfast and ranitidine or Zantac 150 mg by mouth daily at bedtime. Resume other medications as before. Resume usual diet. You must decrease alcohol intake to no more than 2 drinks per day. No driving for 24 hours. Physician will call with biopsy results.   Esophagogastroduodenoscopy, Care After Refer to this sheet in the next few weeks. These instructions provide you with information about caring for yourself after your procedure. Your health care provider may also give you more specific instructions. Your treatment has been planned according to current medical practices, but problems sometimes occur. Call your health care provider if you have any problems or questions after your procedure. What can I expect after the procedure? After the procedure, it is common to have:  A sore throat.  Nausea.  Bloating.  Dizziness.  Fatigue.  Follow these instructions at home:  Do not eat or drink anything until the numbing medicine (local anesthetic) has worn off and your gag reflex has returned. You will know that the local anesthetic has worn off when you can swallow comfortably.  Do not drive for 24 hours if you received a medicine to help you relax (sedative).  If your health care provider took a tissue sample for testing during the procedure, make sure to get your test results. This is your responsibility. Ask your health care provider or the department performing the test when your results will be ready.  Keep all follow-up visits as told by your health care provider. This is important. Contact a health care provider if:  You cannot stop coughing.  You are not urinating.  You are urinating less than usual. Get help right away if:  You have trouble swallowing.  You cannot eat or drink.  You have throat or chest pain that gets worse.  You are dizzy or light-headed.  You faint.  You have nausea or  vomiting.  You have chills.  You have a fever.  You have severe abdominal pain.  You have black, tarry, or bloody stools. This information is not intended to replace advice given to you by your health care provider. Make sure you discuss any questions you have with your health care provider. Document Released: 03/02/2012 Document Revised: 08/22/2015 Document Reviewed: 02/07/2015 Elsevier Interactive Patient Education  2018 [employer].    Colonoscopy, Adult, Care After This sheet gives you information about how to care for yourself after your procedure. Your doctor may also give you more specific instructions. If you have problems or questions, call your doctor. Follow these instructions at home: General instructions   For the first 24 hours after the procedure: ? Do not drive or use machinery. ? Do not sign important documents. ? Do not drink alcohol. ? Do your daily activities more slowly than normal. ? Eat foods that are soft and easy to digest. ? Rest often.  Take over-the-counter or prescription medicines only as told by your doctor.  It is up to you to get the results of your procedure. Ask your doctor, or the department performing the procedure, when your results will be ready. To help cramping and bloating:  Try walking around.  Put heat on your belly (abdomen) as told by your doctor. Use a heat source that your doctor recommends, such as a moist heat pack or a heating pad. ? Put a towel between your skin and the heat source. ? Leave the heat on for 20-30 minutes. ? Remove the heat if your  skin turns bright red. This is especially important if you cannot feel pain, heat, or cold. You can get burned. Eating and drinking  Drink enough fluid to keep your pee (urine) clear or pale yellow.  Return to your normal diet as told by your doctor. Avoid heavy or fried foods that are hard to digest.  Avoid drinking alcohol for as long as told by your doctor. Contact a  doctor if:  You have blood in your poop (stool) 2-3 days after the procedure. Get help right away if:  You have more than a small amount of blood in your poop.  You see large clumps of tissue (blood clots) in your poop.  Your belly is swollen.  You feel sick to your stomach (nauseous).  You throw up (vomit).  You have a fever.  You have belly pain that gets worse, and medicine does not help your pain. This information is not intended to replace advice given to you by your health care provider. Make sure you discuss any questions you have with your health care provider. Document Released: 04/18/2010 Document Revised: 12/09/2015 Document Reviewed: 12/09/2015 Elsevier Interactive Patient Education  2017 [employer] Inc.    Diverticulosis Diverticulosis is a condition that develops when small pouches (diverticula) form in the wall of the large intestine (colon). The colon is where water is absorbed and stool is formed. The pouches form when the inside layer of the colon pushes through weak spots in the outer layers of the colon. You may have a few pouches or many of them. What are the causes? The cause of this condition is not known. What increases the risk? The following factors may make you more likely to develop this condition:  Being older than age 53. Your risk for this condition increases with age. Diverticulosis is rare among people younger than age 25. By age 25, many people have it.  Eating a low-fiber diet.  Having frequent constipation.  Being overweight.  Not getting enough exercise.  Smoking.  Taking over-the-counter pain medicines, like aspirin and ibuprofen.  Having a family history of diverticulosis.  What are the signs or symptoms? In most people, there are no symptoms of this condition. If you do have symptoms, they may include:  Bloating.  Cramps in the abdomen.  Constipation or diarrhea.  Pain in the lower left side of the abdomen.  How is this  diagnosed? This condition is most often diagnosed during an exam for other colon problems. Because diverticulosis usually has no symptoms, it often cannot be diagnosed independently. This condition may be diagnosed by:  Using a flexible scope to examine the colon (colonoscopy).  Taking an X-ray of the colon after dye has been put into the colon (barium enema).  Doing a CT scan.  How is this treated? You may not need treatment for this condition if you have never developed an infection related to diverticulosis. If you have had an infection before, treatment may include:  Eating a high-fiber diet. This may include eating more fruits, vegetables, and grains.  Taking a fiber supplement.  Taking a live bacteria supplement (probiotic).  Taking medicine to relax your colon.  Taking antibiotic medicines.  Follow these instructions at home:  Drink 6-8 glasses of water or more each day to prevent constipation.  Try not to strain when you have a bowel movement.  If you have had an infection before: ? Eat more fiber as directed by your health care provider or your diet and nutrition specialist (dietitian). ?  Take a fiber supplement or probiotic, if your health care provider approves.  Take over-the-counter and prescription medicines only as told by your health care provider.  If you were prescribed an antibiotic, take it as told by your health care provider. Do not stop taking the antibiotic even if you start to feel better.  Keep all follow-up visits as told by your health care provider. This is important. Contact a health care provider if:  You have pain in your abdomen.  You have bloating.  You have cramps.  You have not had a bowel movement in 3 days. Get help right away if:  Your pain gets worse.  Your bloating becomes very bad.  You have a fever or chills, and your symptoms suddenly get worse.  You vomit.  You have bowel movements that are bloody or black.  You  have bleeding from your rectum. Summary  Diverticulosis is a condition that develops when small pouches (diverticula) form in the wall of the large intestine (colon).  You may have a few pouches or many of them.  This condition is most often diagnosed during an exam for other colon problems.  If you have had an infection related to diverticulosis, treatment may include increasing the fiber in your diet, taking supplements, or taking medicines. This information is not intended to replace advice given to you by your health care provider. Make sure you discuss any questions you have with your health care provider. Document Released: 12/12/2003 Document Revised: 02/03/2016 Document Reviewed: 02/03/2016 Elsevier Interactive Patient Education  2017 [employer].   Hemorrhoids Hemorrhoids are swollen veins in and around the rectum or anus. There are two types of hemorrhoids:  Internal hemorrhoids. These occur in the veins that are just inside the rectum. They may poke through to the outside and become irritated and painful.  External hemorrhoids. These occur in the veins that are outside of the anus and can be felt as a painful swelling or hard lump near the anus.  Most hemorrhoids do not cause serious problems, and they can be managed with home treatments such as diet and lifestyle changes. If home treatments do not help your symptoms, procedures can be done to shrink or remove the hemorrhoids. What are the causes? This condition is caused by increased pressure in the anal area. This pressure may result from various things, including:  Constipation.  Straining to have a bowel movement.  Diarrhea.  Pregnancy.  Obesity.  Sitting for long periods of time.  Heavy lifting or other activity that causes you to strain.  Anal sex.  What are the signs or symptoms? Symptoms of this condition include:  Pain.  Anal itching or irritation.  Rectal bleeding.  Leakage of stool  (feces).  Anal swelling.  One or more lumps around the anus.  How is this diagnosed? This condition can often be diagnosed through a visual exam. Other exams or tests may also be done, such as:  Examination of the rectal area with a gloved hand (digital rectal exam).  Examination of the anal canal using a small tube (anoscope).  A blood test, if you have lost a significant amount of blood.  A test to look inside the colon (sigmoidoscopy or colonoscopy).  How is this treated? This condition can usually be treated at home. However, various procedures may be done if dietary changes, lifestyle changes, and other home treatments do not help your symptoms. These procedures can help make the hemorrhoids smaller or remove them completely. Some of  these procedures involve surgery, and others do not. Common procedures include:  Rubber band ligation. Rubber bands are placed at the base of the hemorrhoids to cut off the blood supply to them.  Sclerotherapy. Medicine is injected into the hemorrhoids to shrink them.  Infrared coagulation. A type of light energy is used to get rid of the hemorrhoids.  Hemorrhoidectomy surgery. The hemorrhoids are surgically removed, and the veins that supply them are tied off.  Stapled hemorrhoidopexy surgery. A circular stapling device is used to remove the hemorrhoids and use staples to cut off the blood supply to them.  Follow these instructions at home: Eating and drinking  Eat foods that have a lot of fiber in them, such as whole grains, beans, nuts, fruits, and vegetables. Ask your health care provider about taking products that have added fiber (fiber supplements).  Drink enough fluid to keep your urine clear or pale yellow. Managing pain and swelling  Take warm sitz baths for 20 minutes, 3-4 times a day to ease pain and discomfort.  If directed, apply ice to the affected area. Using ice packs between sitz baths may be helpful. ? Put ice in a plastic  bag. ? Place a towel between your skin and the bag. ? Leave the ice on for 20 minutes, 2-3 times a day. General instructions  Take over-the-counter and prescription medicines only as told by your health care provider.  Use medicated creams or suppositories as told.  Exercise regularly.  Go to the bathroom when you have the urge to have a bowel movement. Do not wait.  Avoid straining to have bowel movements.  Keep the anal area dry and clean. Use wet toilet paper or moist towelettes after a bowel movement.  Do not sit on the toilet for long periods of time. This increases blood pooling and pain. Contact a health care provider if:  You have increasing pain and swelling that are not controlled by treatment or medicine.  You have uncontrolled bleeding.  You have difficulty having a bowel movement, or you are unable to have a bowel movement.  You have pain or inflammation outside the area of the hemorrhoids. This information is not intended to replace advice given to you by your health care provider. Make sure you discuss any questions you have with your health care provider. Document Released: 03/13/2000 Document Revised: 08/14/2015 Document Reviewed: 11/28/2014 Elsevier Interactive Patient Education  2017 [employer].

## 2016-11-12 NOTE — Op Note (Signed)
[facility] Patient Name: Benjamin [name] Procedure Date: 11/12/2016 1:38 PM MRN: [id number] Date of Birth: 1951/03/25 Attending MD: Hildred Laser , MD CSN: [id number] Age: 66 Admit Type: Outpatient Procedure:                Colonoscopy Indications:              High risk colon cancer surveillance: Personal                            history of colonic polyps Providers:                Hildred Laser, MD, Gwenlyn Fudge RN, RN, Rosina Lowenstein, RN Referring MD:             [name], [facility] Medicines:                Midazolam 2 mg IV Complications:            No immediate complications. Estimated Blood Loss:     Estimated blood loss: none. Procedure:                Pre-Anesthesia Assessment:                           - Prior to the procedure, a History and Physical                            was performed, and patient medications and                            allergies were reviewed. The patient's tolerance of                            previous anesthesia was also reviewed. The risks                            and benefits of the procedure and the sedation                            options and risks were discussed with the patient.                            All questions were answered, and informed consent                            was obtained. Prior Anticoagulants: The patient                            last took aspirin 3 days prior to the procedure.                            ASA Grade Assessment: III - A patient with severe  systemic disease. After reviewing the risks and                            benefits, the patient was deemed in satisfactory                            condition to undergo the procedure.                           After obtaining informed consent, the colonoscope                            was passed under direct vision. Throughout the                            procedure, the patient's blood pressure, pulse,  and                            oxygen saturations were monitored continuously. The                            [id number] ([id number]) scope was introduced through                            the anus and advanced to the the cecum, identified                            by appendiceal orifice and ileocecal valve. The                            colonoscopy was somewhat difficult due to a                            tortuous colon. Successful completion of the                            procedure was aided by changing the patient to a                            supine position, using manual pressure and                            withdrawing and reinserting the scope. The patient                            tolerated the procedure well. The quality of the                            bowel preparation was adequate. The ileocecal                            valve, appendiceal orifice, and rectum were  photographed. Scope In: 1:40:12 PM Scope Out: 2:02:03 PM Scope Withdrawal Time: 0 hours 9 minutes 41 seconds  Total Procedure Duration: 0 hours 21 minutes 51 seconds  Findings:      The perianal and digital rectal examinations were normal.      Scattered medium-mouthed diverticula were found in the sigmoid colon.      The exam was otherwise normal throughout the examined colon.      External hemorrhoids were found during retroflexion. The hemorrhoids       were small. Impression:               - Diverticulosis in the sigmoid colon.                           - External hemorrhoids.                           - No specimens collected. Moderate Sedation:      Moderate (conscious) sedation was administered by the endoscopy nurse       and supervised by the endoscopist. The following parameters were       monitored: oxygen saturation, heart rate, blood pressure, CO2       capnography and response to care. Total physician intraservice time was       37 minutes. Recommendation:            - Patient has a contact number available for                            emergencies. The signs and symptoms of potential                            delayed complications were discussed with the                            patient. Return to normal activities tomorrow.                            Written discharge instructions were provided to the                            patient.                           - Resume previous diet today.                           - Continue present medications.                           - Repeat colonoscopy in 7 years for surveillance. Procedure Code(s):        --- Professional ---                           [phone], Colonoscopy, flexible; diagnostic, including                            collection of specimen(s) by brushing or washing,  when performed (separate procedure)                           99152, Moderate sedation services provided by the                            same physician or other qualified health care                            professional performing the diagnostic or                            therapeutic service that the sedation supports,                            requiring the presence of an independent trained                            observer to assist in the monitoring of the                            patient's level of consciousness and physiological                            status; initial 15 minutes of intraservice time,                            patient age 40 years or older                           [phone], Moderate sedation services; each additional                            15 minutes intraservice time Diagnosis Code(s):        --- Professional ---                           Z86.010, Personal history of colonic polyps                           K64.4, Residual hemorrhoidal skin tags                           K57.30, Diverticulosis of large intestine without                             perforation or abscess without bleeding CPT copyright 2016 [employer]. All rights reserved. The codes documented in this report are preliminary and upon coder review may  be revised to meet current compliance requirements. Hildred Laser, MD Hildred Laser, MD 11/12/2016 2:18:26 PM This report has been signed electronically. Number of Addenda: 0

## 2016-11-12 NOTE — Op Note (Signed)
[facility] Patient Name: Benjamin [name] Procedure Date: 11/12/2016 12:11 PM MRN: [id number] Date of Birth: Aug 19, 1950 Attending MD: Hildred Laser , MD CSN: [id number] Age: 66 Admit Type: Outpatient Procedure:                Upper GI endoscopy Indications:              Barrett's esophagus, Follow-up of Barrett's                            esophagus Providers:                Hildred Laser, MD, [place] B. Sharon Seller, RN, Rosina Lowenstein, RN Referring MD:             [name], [facility] Medicines:                Lidocaine spray, Meperidine 50 mg IV, Midazolam 6                            mg IV Complications:            No immediate complications. Estimated Blood Loss:     Estimated blood loss was minimal. Procedure:                Pre-Anesthesia Assessment:                           - Prior to the procedure, a History and Physical                            was performed, and patient medications and                            allergies were reviewed. The patient's tolerance of                            previous anesthesia was also reviewed. The risks                            and benefits of the procedure and the sedation                            options and risks were discussed with the patient.                            All questions were answered, and informed consent                            was obtained. Prior Anticoagulants: The patient                            last took aspirin 3 days prior to the procedure.  ASA Grade Assessment: III - A patient with severe                            systemic disease. After reviewing the risks and                            benefits, the patient was deemed in satisfactory                            condition to undergo the procedure.                           After obtaining informed consent, the endoscope was                            passed under direct vision. Throughout the                 procedure, the patient's blood pressure, pulse, and                            oxygen saturations were monitored continuously. The                            EG-[id number] ([id number]) scope was introduced through the                            mouth, and advanced to the second part of duodenum.                            The upper GI endoscopy was accomplished without                            difficulty. The patient tolerated the procedure                            well. Scope In: 1:22:05 PM Scope Out: 1:35:46 PM Total Procedure Duration: 0 hours 13 minutes 41 seconds  Findings:      The proximal esophagus and mid esophagus were normal.      LA Grade B (one or more mucosal breaks greater than 5 mm, not extending       between the tops of two mucosal folds) esophagitis was found 39 to 40 cm       from the incisors.      There were esophageal mucosal changes secondary to established       short-segment Barrett's disease present in the distal esophagus. The       maximum longitudinal extent of these mucosal changes was 1 cm in length.       Mucosa was biopsied with a cold forceps for histology. One specimen       bottle was sent to pathology. The pathology specimen was placed into       Bottle Number 2.      The Z-line was irregular and was found 40 cm from the incisors.      Diffuse moderate inflammation with hemorrhage characterized by adherent  blood, congestion (edema), erosions, erythema and friability was found       in the entire examined stomach. Biopsies were taken with a cold forceps       for histology. The pathology specimen was placed into Bottle Number 1.      Mild portal hypertensive gastropathy was found in the gastric fundus.      A few erosions without bleeding were found in the duodenal bulb.      The second portion of the duodenum was normal. Impression:               - Normal proximal esophagus and mid esophagus.                           - LA Grade B  reflux esophagitis.                           - Esophageal mucosal changes secondary to                            established short-segment Barrett's disease.                            Biopsied.                           - Z-line irregular, 40 cm from the incisors.                           - Gastritis with hemorrhage. Biopsied.                           - Portal hypertensive gastropathy.                           - Duodenal erosions without bleeding.                           - Normal second portion of the duodenum.                           comment: Diffuse gastritis most likely due to                            alcohol mediated injury and this could be source of                            his iron deficiency anemia which has responded to                            by mouth iron.                           Biopsy taken to rule out pylori gastritis. Moderate Sedation:      Moderate (conscious) sedation was administered by the endoscopy nurse       and supervised by the endoscopist. The following parameters were       monitored: oxygen saturation,  heart rate, blood pressure, CO2       capnography and response to care. Total physician intraservice time was       18 minutes. Recommendation:           - Patient has a contact number available for                            emergencies. The signs and symptoms of potential                            delayed complications were discussed with the                            patient. Return to normal activities tomorrow.                            Written discharge instructions were provided to the                            patient.                           - Resume previous diet today.                           - Continue present medications.                           - No aspirin, ibuprofen, naproxen, or other                            non-steroidal anti-inflammatory drugs for 1 day.                           - Await pathology results.                            - See the other procedure note for documentation of                            additional recommendations. Procedure Code(s):        --- Professional ---                           [phone], Esophagogastroduodenoscopy, flexible,                            transoral; with biopsy, single or multiple                           99152, Moderate sedation services provided by the                            same physician or other qualified health care  professional performing the diagnostic or                            therapeutic service that the sedation supports,                            requiring the presence of an independent trained                            observer to assist in the monitoring of the                            patient's level of consciousness and physiological                            status; initial 15 minutes of intraservice time,                            patient age 53 years or older Diagnosis Code(s):        --- Professional ---                           K22.70, Barrett's esophagus without dysplasia                           K21.0, Gastro-esophageal reflux disease with                            esophagitis                           K22.8, Other specified diseases of esophagus                           K29.71, Gastritis, unspecified, with bleeding                           K76.6, Portal hypertension                           K31.89, Other diseases of stomach and duodenum                           K26.9, Duodenal ulcer, unspecified as acute or                            chronic, without hemorrhage or perforation CPT copyright 2016 American [employer]. All rights reserved. The codes documented in this report are preliminary and upon coder review may  be revised to meet current compliance requirements. Hildred Laser, MD Hildred Laser, MD 11/12/2016 2:14:48 PM This report has been signed electronically. Number  of Addenda: 0

## 2016-11-12 NOTE — H&P (Signed)
Benjamin [name] is an 66 y.o. male.   Chief Complaint: Patient is here for EGD and colonoscopy. HPI: Patient is 66 year old Caucasian male with history of GERD complicated by Barrett's esophagus as well as history of colonic adenomas who is here for surveillance examination. Few months ago he was noted to be iron deficient. He has responded to by mouth iron. He denies melena or rectal bleeding. He states no blood was found in his stool. He says he has lost some weight. He does not eat 3 meals a day. Sometimes his wife does not appear meals. He smokes 3 packs of cigarettes per day and drinks 6 cans of beer daily. Mother died of stomach cancer at 86. Last EGD and colonoscopy was in 2011. Family history is negative for CRC.  Past Medical History:  Diagnosis Date  . Alcohol ingestion of more than four drinks per day    6 pack of beer per day  . Barrett esophagus   . CAD (coronary artery disease)   . Carotid artery disease ([place])    Status post bilateral carotid endarterectomies  . COPD (chronic obstructive pulmonary disease) ([place])   . Diabetes mellitus   . Dyslipidemia   . Ejection fraction   . GERD (gastroesophageal reflux disease)   . Hx of CABG    CABG, 2006  . Hypertension   . Ischemic cardiomyopathy    EF 35-40%, echo, 2011  . PAD (peripheral artery disease) ([place])    No  aortic aneurysm 2011  . Tobacco abuse     Past Surgical History:  Procedure Laterality Date  . CARDIAC SURGERY    . CAROTID ARTERY ANGIOPLASTY     Bilateral  . CAROTID ENDARTERECTOMY  Jul 30, 2004   Right cea  . CAROTID ENDARTERECTOMY  Jan. 23, 2009   Left cea  . COLONOSCOPY    . CORONARY ARTERY BYPASS GRAFT     Dr. Clover Mealy  . HERNIA REPAIR    . UPPER GASTROINTESTINAL ENDOSCOPY      Family History  Problem Relation Age of Onset  . Heart disease Father   . Hypertension Father   . Heart attack Father   . Rheum arthritis Sister   . Hypertension Sister   . Healthy Daughter   . Healthy Son   .  Healthy Son   . Cancer Mother   . Hypertension Mother    Social History:  reports that he has been smoking Cigarettes.  He started smoking about 58 years ago. He has a 156.00 pack-year smoking history. He has never used smokeless tobacco. He reports that he drinks alcohol. He reports that he does not use drugs.  Allergies:  Allergies  Allergen Reactions  . Morphine And Related     MIGRAINE    Medications Prior to Admission  Medication Sig Dispense Refill  . Albuterol Sulfate (VENTOLIN HFA IN) Inhale into the lungs. As needed      . aspirin 81 MG tablet Take 81 mg by mouth daily.      Marland Kitchen esomeprazole (NEXIUM) 40 MG capsule TAKE 1 CAPSULE DAILY 90 capsule 3  . ferrous sulfate 325 (65 FE) MG EC tablet Take 65 mg by mouth 3 (three) times daily with meals.    Marland Kitchen lisinopril (PRINIVIL,ZESTRIL) 20 MG tablet Take 20 mg by mouth daily.    . metFORMIN (GLUCOPHAGE) 500 MG tablet Take 500 mg by mouth 3 (three) times daily.     . metoprolol tartrate (LOPRESSOR) 25 MG tablet Take 0.5 tablets (12.5  mg total) by mouth 2 (two) times daily. 90 tablet 3  . simvastatin (ZOCOR) 40 MG tablet Take 40 mg by mouth daily.     Marland Kitchen gabapentin (NEURONTIN) 600 MG tablet Take 600 mg by mouth 2 (two) times daily. At Bedtime     . ranitidine (ZANTAC) 150 MG tablet Take 150 mg by mouth 2 (two) times daily.      No results found for this or any previous visit (from the past 48 hour(s)). No results found.  ROS  Blood pressure (!) 177/80, pulse 73, temperature 97.7 F (36.5 C), temperature source Oral, resp. rate (!) 23, height 5\' 8"  (1.727 m), weight 136 lb (61.7 kg), SpO2 99 %. Physical Exam  Constitutional: He appears well-developed and well-nourished.  HENT:  Mouth/Throat: Oropharynx is clear and moist.  Eyes: Conjunctivae are normal. No scleral icterus.  Neck: No thyromegaly present.  Left carotid endarterectomy scar  Cardiovascular: Normal rate, regular rhythm and normal heart sounds.   No murmur  heard. Respiratory: Effort normal and breath sounds normal.  Midsternal scar.  GI:  Abdomen is symmetrical and soft without organomegaly or masses. Small umbilical hernia noted. It is completely reducible.  Musculoskeletal: He exhibits no edema.  Lymphadenopathy:    He has no cervical adenopathy.  Neurological: He is alert.  Skin: Skin is warm and dry.   Lab data from 08/13/2016 H&H 14 and 43.3 MCV 73.6 and platelet count 319K. Serum ferritin was 51.   Assessment/Plan History of Barrett's esophagus and colonic polyps. History of iron deficiency anemia. H&H is now normal. He possibly has impaired iron absorption due to PPI therapy. Surveillance EGD and colonoscopy.  Hildred Laser, MD 11/12/2016, 1:09 PM

## 2016-11-16 ENCOUNTER — Encounter (HOSPITAL_COMMUNITY): Payer: Self-pay | Admitting: Internal Medicine

## 2017-02-08 ENCOUNTER — Other Ambulatory Visit: Payer: Self-pay | Admitting: Cardiovascular Disease

## 2017-02-08 MED ORDER — METOPROLOL TARTRATE 25 MG PO TABS: 12.5000 mg | ORAL_TABLET | Freq: Two times a day (BID) | ORAL | 1 refills | Status: DC

## 2017-02-08 NOTE — Telephone Encounter (Signed)
°*  STAT* If patient is at the pharmacy, call can be transferred to refill team.   1. Which medications need to be refilled? metoprolol tartrate (LOPRESSOR) 25 MG tablet   2. Which pharmacy/location (including street and city if local pharmacy) is medication to be sent to? [facility] mail [facility] 3. Do they need a 30 day or 90 day supply? [facility]

## 2017-02-08 NOTE — Telephone Encounter (Signed)
Medication sent to pharmacy  

## 2017-03-22 ENCOUNTER — Encounter: Payer: Self-pay | Admitting: Cardiovascular Disease

## 2017-03-22 ENCOUNTER — Ambulatory Visit (INDEPENDENT_AMBULATORY_CARE_PROVIDER_SITE_OTHER): Payer: Medicare PPO | Admitting: Cardiovascular Disease

## 2017-03-22 VITALS — BP 132/80 | HR 82 | Ht 69.0 in | Wt 137.0 lb

## 2017-03-22 DIAGNOSIS — I25708 Atherosclerosis of coronary artery bypass graft(s), unspecified, with other forms of angina pectoris: Secondary | ICD-10-CM | POA: Diagnosis not present

## 2017-03-22 DIAGNOSIS — E7849 Other hyperlipidemia: Secondary | ICD-10-CM | POA: Diagnosis not present

## 2017-03-22 DIAGNOSIS — I10 Essential (primary) hypertension: Secondary | ICD-10-CM | POA: Diagnosis not present

## 2017-03-22 DIAGNOSIS — Z9889 Other specified postprocedural states: Secondary | ICD-10-CM | POA: Diagnosis not present

## 2017-03-22 DIAGNOSIS — Z720 Tobacco use: Secondary | ICD-10-CM | POA: Diagnosis not present

## 2017-03-22 MED ORDER — NITROGLYCERIN 0.4 MG SL SUBL: 0.4000 mg | SUBLINGUAL_TABLET | SUBLINGUAL | 3 refills | Status: AC | PRN

## 2017-03-22 NOTE — Progress Notes (Signed)
SUBJECTIVE: The patient presents for routine follow-up. He has a history of coronary artery disease and CABG in 2006. He also has a history of hypertension, dyslipidemia, and tobacco abuse.  Echocardiogram on 05/11/12 demonstrated normal left ventricular systolic function and regional wall motion, EF 55%. Diastolic dysfunction was present.  He has a history of bilateral carotid endarterectomies with Dopplers on 05/28/14 demonstrating less than 40% stenosis bilaterally.  ECG performed today which I personally interpreted demonstrates sinus rhythm with old anteroseptal infarct.  He denies chest pain, palpitations, and leg swelling.  He has chronic exertional dyspnea from many years of tobacco abuse which has not gotten any worse.  He smokes 3 packs of cigarettes daily.  He started smoking at the age of 30.  He has tried quitting before and has tried Chantix and nicotine patches without success.   Review of Systems: As per "subjective", otherwise negative.  Allergies  Allergen Reactions  . Morphine And Related     MIGRAINE    Current Outpatient Medications  Medication Sig Dispense Refill  . aspirin 81 MG tablet Take 1 tablet (81 mg total) by mouth daily. 30 tablet   . lisinopril (PRINIVIL,ZESTRIL) 20 MG tablet Take 20 mg by mouth daily.    . metFORMIN (GLUCOPHAGE) 500 MG tablet Take 500 mg by mouth 3 (three) times daily.     . metoprolol tartrate (LOPRESSOR) 25 MG tablet Take 0.5 tablets (12.5 mg total) 2 (two) times daily by mouth. 90 tablet 1  . ranitidine (ZANTAC) 75 MG tablet Take 75 mg by mouth 2 (two) times daily.    . simvastatin (ZOCOR) 40 MG tablet Take 40 mg by mouth daily.      No current facility-administered medications for this visit.     Past Medical History:  Diagnosis Date  . Alcohol ingestion of more than four drinks per day    6 pack of beer per day  . Barrett esophagus   . CAD (coronary artery disease)   . Carotid artery disease ([place])    Status post  bilateral carotid endarterectomies  . COPD (chronic obstructive pulmonary disease) ([place])   . Diabetes mellitus   . Dyslipidemia   . Ejection fraction   . GERD (gastroesophageal reflux disease)   . Hx of CABG    CABG, 2006  . Hypertension   . Ischemic cardiomyopathy    EF 35-40%, echo, 2011  . PAD (peripheral artery disease) ([place])    No  aortic aneurysm 2011  . Tobacco abuse     Past Surgical History:  Procedure Laterality Date  . BIOPSY  11/12/2016   Procedure: BIOPSY;  Surgeon: Rogene Houston, MD;  Location: [facility];  Service: Endoscopy;;  gastric esophageal  . CARDIAC SURGERY    . CAROTID ARTERY ANGIOPLASTY     Bilateral  . CAROTID ENDARTERECTOMY  Jul 30, 2004   Right cea  . CAROTID ENDARTERECTOMY  Jan. 23, 2009   Left cea  . COLONOSCOPY    . COLONOSCOPY N/A 11/12/2016   Procedure: COLONOSCOPY;  Surgeon: Rogene Houston, MD;  Location: [facility];  Service: Endoscopy;  Laterality: N/A;  . CORONARY ARTERY BYPASS GRAFT     Dr. Clover Mealy  . ESOPHAGOGASTRODUODENOSCOPY N/A 11/12/2016   Procedure: ESOPHAGOGASTRODUODENOSCOPY (EGD);  Surgeon: Rogene Houston, MD;  Location: [facility];  Service: Endoscopy;  Laterality: N/A;  1:00  . HERNIA REPAIR    . UPPER GASTROINTESTINAL ENDOSCOPY      Social History  Socioeconomic History  . Marital status: Married    Spouse name: Not on file  . Number of children: Not on file  . Years of education: Not on file  . Highest education level: Not on file  Social Needs  . Financial resource strain: Not on file  . Food insecurity - worry: Not on file  . Food insecurity - inability: Not on file  . Transportation needs - medical: Not on file  . Transportation needs - non-medical: Not on file  Occupational History  . Not on file  Tobacco Use  . Smoking status: Current Every Day Smoker    Packs/day: 3.00    Years: 52.00    Pack years: 156.00    Types: Cigarettes    Start date: 03/05/1958  . Smokeless tobacco: Never Used    . Tobacco comment: Patient smokes 3 packs a day  Substance and Sexual Activity  . Alcohol use: Yes    Alcohol/week: 0.0 oz    Comment: Patient states that he drinks a 6 pack a day  . Drug use: No  . Sexual activity: Not on file  Other Topics Concern  . Not on file  Social History Narrative  . Not on file     Vitals:   03/22/17 1035  BP: 132/80  Pulse: 82  SpO2: 98%  Weight: 137 lb (62.1 kg)  Height: 5\' 9"  (1.753 m)    Wt Readings from Last 3 Encounters:  03/22/17 137 lb (62.1 kg)  11/12/16 136 lb (61.7 kg)  08/13/16 136 lb 8 oz (61.9 kg)     PHYSICAL EXAM General: NAD HEENT: Normal. Neck: No JVD, no thyromegaly. Lungs: Diminished throughout, bilateral scattered rhonchi, no wheezes or crackles. CV: Regular rate and rhythm, normal S1/S2, no S3/S4, no murmur. No pretibial or periankle edema.  No carotid bruit.   Abdomen: Soft, nontender, no distention.  Neurologic: Alert and oriented.  Psych: Normal affect. Skin: Normal. Musculoskeletal: No gross deformities.    ECG: Most recent ECG reviewed.   Labs: Lab Results  Component Value Date/Time   K 3.9 04/23/2007 04:00 AM   BUN 5 (L) 04/23/2007 04:00 AM   CREATININE 0.62 04/23/2007 04:00 AM   ALT 16 04/19/2007 11:16 AM   HGB 14.0 08/13/2016 10:41 AM     Lipids: No results found for: LDLCALC, LDLDIRECT, CHOL, TRIG, HDL     ASSESSMENT AND PLAN:  1. CAD with CABG (2006): Symptomatically stable. Continue ASA, metoprolol, lisinopril, and simvastatin.  I will provide a prescription for sublingual nitroglycerin.  2. Essential HTN: Controlled. No changes.  3. Hyperlipidemia: Continue simvastatin.  I will obtain a copy of lipids from PCP for personal review.  4. PVD with h/o bilateral carotid endarterectomies: Continue ASA and statin. Dopplers reviewed above. Follows with vascular surgery.  5.  Tobacco abuse: He smokes 3 packs of cigarettes daily.  He started smoking at the age of 55.  He has tried quitting  before and has tried Chantix and nicotine patches without success.   Disposition: Follow up 1 year.   Kate Sable, M.D., F.A.C.C.

## 2017-03-22 NOTE — Patient Instructions (Signed)
Medication Instructions:   Nitroglycerin as needed for severe chest pain only.  Continue all other medications.    Labwork: none  Testing/Procedures: none  Follow-Up: Your physician wants you to follow up in:  1 year.  You will receive a reminder letter in the mail one-two months in advance.  If you don't receive a letter, please call our office to schedule the follow up appointment   Any Other Special Instructions Will Be Listed Below (If Applicable).  If you need a refill on your cardiac medications before your next appointment, please call your pharmacy.

## 2017-03-24 ENCOUNTER — Encounter: Payer: Self-pay | Admitting: *Deleted

## 2017-05-31 DIAGNOSIS — I10 Essential (primary) hypertension: Secondary | ICD-10-CM | POA: Diagnosis not present

## 2017-05-31 DIAGNOSIS — E871 Hypo-osmolality and hyponatremia: Secondary | ICD-10-CM | POA: Diagnosis not present

## 2017-05-31 DIAGNOSIS — K210 Gastro-esophageal reflux disease with esophagitis: Secondary | ICD-10-CM | POA: Diagnosis not present

## 2017-05-31 DIAGNOSIS — E1159 Type 2 diabetes mellitus with other circulatory complications: Secondary | ICD-10-CM | POA: Diagnosis not present

## 2017-05-31 DIAGNOSIS — D500 Iron deficiency anemia secondary to blood loss (chronic): Secondary | ICD-10-CM | POA: Diagnosis not present

## 2017-05-31 DIAGNOSIS — E7800 Pure hypercholesterolemia, unspecified: Secondary | ICD-10-CM | POA: Diagnosis not present

## 2017-05-31 DIAGNOSIS — R202 Paresthesia of skin: Secondary | ICD-10-CM | POA: Diagnosis not present

## 2017-05-31 DIAGNOSIS — J449 Chronic obstructive pulmonary disease, unspecified: Secondary | ICD-10-CM | POA: Diagnosis not present

## 2017-06-01 DIAGNOSIS — E785 Hyperlipidemia, unspecified: Secondary | ICD-10-CM | POA: Diagnosis not present

## 2017-06-01 DIAGNOSIS — I10 Essential (primary) hypertension: Secondary | ICD-10-CM | POA: Diagnosis not present

## 2017-06-01 DIAGNOSIS — Z7984 Long term (current) use of oral hypoglycemic drugs: Secondary | ICD-10-CM | POA: Diagnosis not present

## 2017-06-01 DIAGNOSIS — J439 Emphysema, unspecified: Secondary | ICD-10-CM | POA: Diagnosis not present

## 2017-06-01 DIAGNOSIS — I2510 Atherosclerotic heart disease of native coronary artery without angina pectoris: Secondary | ICD-10-CM | POA: Diagnosis not present

## 2017-06-01 DIAGNOSIS — F17200 Nicotine dependence, unspecified, uncomplicated: Secondary | ICD-10-CM | POA: Diagnosis not present

## 2017-06-01 DIAGNOSIS — E1140 Type 2 diabetes mellitus with diabetic neuropathy, unspecified: Secondary | ICD-10-CM | POA: Diagnosis not present

## 2017-06-01 DIAGNOSIS — E871 Hypo-osmolality and hyponatremia: Secondary | ICD-10-CM | POA: Diagnosis not present

## 2017-06-01 DIAGNOSIS — K219 Gastro-esophageal reflux disease without esophagitis: Secondary | ICD-10-CM | POA: Diagnosis not present

## 2017-06-02 DIAGNOSIS — E782 Mixed hyperlipidemia: Secondary | ICD-10-CM | POA: Diagnosis not present

## 2017-06-02 DIAGNOSIS — Z23 Encounter for immunization: Secondary | ICD-10-CM | POA: Diagnosis not present

## 2017-06-02 DIAGNOSIS — E1159 Type 2 diabetes mellitus with other circulatory complications: Secondary | ICD-10-CM | POA: Diagnosis not present

## 2017-06-02 DIAGNOSIS — I10 Essential (primary) hypertension: Secondary | ICD-10-CM | POA: Diagnosis not present

## 2017-06-02 DIAGNOSIS — Z6820 Body mass index (BMI) 20.0-20.9, adult: Secondary | ICD-10-CM | POA: Diagnosis not present

## 2017-06-02 DIAGNOSIS — I708 Atherosclerosis of other arteries: Secondary | ICD-10-CM | POA: Diagnosis not present

## 2017-06-02 DIAGNOSIS — J449 Chronic obstructive pulmonary disease, unspecified: Secondary | ICD-10-CM | POA: Diagnosis not present

## 2017-06-02 DIAGNOSIS — E871 Hypo-osmolality and hyponatremia: Secondary | ICD-10-CM | POA: Diagnosis not present

## 2017-06-02 DIAGNOSIS — R202 Paresthesia of skin: Secondary | ICD-10-CM | POA: Diagnosis not present

## 2017-06-04 DIAGNOSIS — E871 Hypo-osmolality and hyponatremia: Secondary | ICD-10-CM | POA: Diagnosis not present

## 2017-06-28 ENCOUNTER — Other Ambulatory Visit: Payer: Self-pay | Admitting: Cardiovascular Disease

## 2017-09-25 DIAGNOSIS — L03317 Cellulitis of buttock: Secondary | ICD-10-CM | POA: Diagnosis not present

## 2017-09-28 DIAGNOSIS — E871 Hypo-osmolality and hyponatremia: Secondary | ICD-10-CM | POA: Diagnosis not present

## 2017-09-28 DIAGNOSIS — K210 Gastro-esophageal reflux disease with esophagitis: Secondary | ICD-10-CM | POA: Diagnosis not present

## 2017-09-28 DIAGNOSIS — R202 Paresthesia of skin: Secondary | ICD-10-CM | POA: Diagnosis not present

## 2017-09-28 DIAGNOSIS — R42 Dizziness and giddiness: Secondary | ICD-10-CM | POA: Diagnosis not present

## 2017-09-28 DIAGNOSIS — D649 Anemia, unspecified: Secondary | ICD-10-CM | POA: Diagnosis not present

## 2017-09-28 DIAGNOSIS — J449 Chronic obstructive pulmonary disease, unspecified: Secondary | ICD-10-CM | POA: Diagnosis not present

## 2017-09-28 DIAGNOSIS — E782 Mixed hyperlipidemia: Secondary | ICD-10-CM | POA: Diagnosis not present

## 2017-09-28 DIAGNOSIS — I10 Essential (primary) hypertension: Secondary | ICD-10-CM | POA: Diagnosis not present

## 2017-09-28 DIAGNOSIS — E1159 Type 2 diabetes mellitus with other circulatory complications: Secondary | ICD-10-CM | POA: Diagnosis not present

## 2017-09-28 DIAGNOSIS — D500 Iron deficiency anemia secondary to blood loss (chronic): Secondary | ICD-10-CM | POA: Diagnosis not present

## 2017-09-28 DIAGNOSIS — E7800 Pure hypercholesterolemia, unspecified: Secondary | ICD-10-CM | POA: Diagnosis not present

## 2017-10-01 DIAGNOSIS — I10 Essential (primary) hypertension: Secondary | ICD-10-CM | POA: Diagnosis not present

## 2017-10-01 DIAGNOSIS — E782 Mixed hyperlipidemia: Secondary | ICD-10-CM | POA: Diagnosis not present

## 2017-10-01 DIAGNOSIS — R202 Paresthesia of skin: Secondary | ICD-10-CM | POA: Diagnosis not present

## 2017-10-01 DIAGNOSIS — I708 Atherosclerosis of other arteries: Secondary | ICD-10-CM | POA: Diagnosis not present

## 2017-10-01 DIAGNOSIS — D692 Other nonthrombocytopenic purpura: Secondary | ICD-10-CM | POA: Diagnosis not present

## 2017-10-01 DIAGNOSIS — Z6820 Body mass index (BMI) 20.0-20.9, adult: Secondary | ICD-10-CM | POA: Diagnosis not present

## 2017-10-01 DIAGNOSIS — J449 Chronic obstructive pulmonary disease, unspecified: Secondary | ICD-10-CM | POA: Diagnosis not present

## 2017-10-01 DIAGNOSIS — E1159 Type 2 diabetes mellitus with other circulatory complications: Secondary | ICD-10-CM | POA: Diagnosis not present

## 2017-11-15 ENCOUNTER — Ambulatory Visit (INDEPENDENT_AMBULATORY_CARE_PROVIDER_SITE_OTHER): Payer: Medicare PPO | Admitting: Internal Medicine

## 2017-11-15 ENCOUNTER — Other Ambulatory Visit: Payer: Self-pay | Admitting: Cardiovascular Disease

## 2017-11-17 ENCOUNTER — Ambulatory Visit (INDEPENDENT_AMBULATORY_CARE_PROVIDER_SITE_OTHER): Payer: Medicare PPO | Admitting: Internal Medicine

## 2018-01-12 DIAGNOSIS — H8110 Benign paroxysmal vertigo, unspecified ear: Secondary | ICD-10-CM | POA: Diagnosis not present

## 2018-01-12 DIAGNOSIS — Z6820 Body mass index (BMI) 20.0-20.9, adult: Secondary | ICD-10-CM | POA: Diagnosis not present

## 2018-01-19 ENCOUNTER — Other Ambulatory Visit: Payer: Self-pay | Admitting: Cardiovascular Disease

## 2018-02-01 DIAGNOSIS — F321 Major depressive disorder, single episode, moderate: Secondary | ICD-10-CM | POA: Diagnosis not present

## 2018-02-01 DIAGNOSIS — N189 Chronic kidney disease, unspecified: Secondary | ICD-10-CM | POA: Diagnosis not present

## 2018-02-01 DIAGNOSIS — D519 Vitamin B12 deficiency anemia, unspecified: Secondary | ICD-10-CM | POA: Diagnosis not present

## 2018-02-01 DIAGNOSIS — D649 Anemia, unspecified: Secondary | ICD-10-CM | POA: Diagnosis not present

## 2018-02-01 DIAGNOSIS — E1165 Type 2 diabetes mellitus with hyperglycemia: Secondary | ICD-10-CM | POA: Diagnosis not present

## 2018-02-01 DIAGNOSIS — E039 Hypothyroidism, unspecified: Secondary | ICD-10-CM | POA: Diagnosis not present

## 2018-02-01 DIAGNOSIS — I10 Essential (primary) hypertension: Secondary | ICD-10-CM | POA: Diagnosis not present

## 2018-02-01 DIAGNOSIS — K219 Gastro-esophageal reflux disease without esophagitis: Secondary | ICD-10-CM | POA: Diagnosis not present

## 2018-02-01 DIAGNOSIS — E782 Mixed hyperlipidemia: Secondary | ICD-10-CM | POA: Diagnosis not present

## 2018-02-01 DIAGNOSIS — D529 Folate deficiency anemia, unspecified: Secondary | ICD-10-CM | POA: Diagnosis not present

## 2018-02-01 DIAGNOSIS — E7800 Pure hypercholesterolemia, unspecified: Secondary | ICD-10-CM | POA: Diagnosis not present

## 2018-02-04 DIAGNOSIS — K219 Gastro-esophageal reflux disease without esophagitis: Secondary | ICD-10-CM | POA: Diagnosis not present

## 2018-02-04 DIAGNOSIS — J449 Chronic obstructive pulmonary disease, unspecified: Secondary | ICD-10-CM | POA: Diagnosis not present

## 2018-02-04 DIAGNOSIS — Z6820 Body mass index (BMI) 20.0-20.9, adult: Secondary | ICD-10-CM | POA: Diagnosis not present

## 2018-02-04 DIAGNOSIS — J309 Allergic rhinitis, unspecified: Secondary | ICD-10-CM | POA: Diagnosis not present

## 2018-02-04 DIAGNOSIS — Z0001 Encounter for general adult medical examination with abnormal findings: Secondary | ICD-10-CM | POA: Diagnosis not present

## 2018-02-04 DIAGNOSIS — I10 Essential (primary) hypertension: Secondary | ICD-10-CM | POA: Diagnosis not present

## 2018-02-04 DIAGNOSIS — I708 Atherosclerosis of other arteries: Secondary | ICD-10-CM | POA: Diagnosis not present

## 2018-02-04 DIAGNOSIS — Z23 Encounter for immunization: Secondary | ICD-10-CM | POA: Diagnosis not present

## 2018-02-04 DIAGNOSIS — R202 Paresthesia of skin: Secondary | ICD-10-CM | POA: Diagnosis not present

## 2018-02-04 DIAGNOSIS — E782 Mixed hyperlipidemia: Secondary | ICD-10-CM | POA: Diagnosis not present

## 2018-02-04 DIAGNOSIS — E1159 Type 2 diabetes mellitus with other circulatory complications: Secondary | ICD-10-CM | POA: Diagnosis not present

## 2018-03-28 ENCOUNTER — Other Ambulatory Visit: Payer: Self-pay | Admitting: Cardiovascular Disease

## 2018-04-11 DIAGNOSIS — J441 Chronic obstructive pulmonary disease with (acute) exacerbation: Secondary | ICD-10-CM | POA: Diagnosis not present

## 2018-04-11 DIAGNOSIS — Z6820 Body mass index (BMI) 20.0-20.9, adult: Secondary | ICD-10-CM | POA: Diagnosis not present

## 2018-04-20 ENCOUNTER — Encounter: Payer: Self-pay | Admitting: *Deleted

## 2018-04-20 ENCOUNTER — Encounter: Payer: Self-pay | Admitting: Cardiovascular Disease

## 2018-04-20 ENCOUNTER — Ambulatory Visit: Payer: Medicare PPO | Admitting: Cardiovascular Disease

## 2018-04-20 ENCOUNTER — Telehealth: Payer: Self-pay | Admitting: Cardiovascular Disease

## 2018-04-20 VITALS — BP 159/79 | HR 68 | Ht 69.0 in | Wt 139.2 lb

## 2018-04-20 DIAGNOSIS — I25708 Atherosclerosis of coronary artery bypass graft(s), unspecified, with other forms of angina pectoris: Secondary | ICD-10-CM

## 2018-04-20 DIAGNOSIS — M79604 Pain in right leg: Secondary | ICD-10-CM

## 2018-04-20 DIAGNOSIS — Z951 Presence of aortocoronary bypass graft: Secondary | ICD-10-CM

## 2018-04-20 DIAGNOSIS — E785 Hyperlipidemia, unspecified: Secondary | ICD-10-CM | POA: Diagnosis not present

## 2018-04-20 DIAGNOSIS — I10 Essential (primary) hypertension: Secondary | ICD-10-CM

## 2018-04-20 DIAGNOSIS — Z720 Tobacco use: Secondary | ICD-10-CM | POA: Diagnosis not present

## 2018-04-20 DIAGNOSIS — Z9889 Other specified postprocedural states: Secondary | ICD-10-CM | POA: Diagnosis not present

## 2018-04-20 DIAGNOSIS — M79605 Pain in left leg: Secondary | ICD-10-CM | POA: Diagnosis not present

## 2018-04-20 NOTE — Progress Notes (Signed)
SUBJECTIVE: The patient presents for routine follow-up. He has a history of coronary artery disease and CABG in 2006. He also has a history of hypertension, dyslipidemia, and tobacco abuse.  Echocardiogram on 05/11/12 demonstrated normal left ventricular systolic function and regional wall motion, EF 55%. Diastolic dysfunction was present.  He has a history of bilateral carotid endarterectomies with Dopplers on 05/28/14 demonstrating less than 40% stenosis bilaterally.  He smokes 3 packs of cigarettes daily.  He started smoking at the age of 64.  He has tried quitting before and has tried Chantix and nicotine patches without success.  He denies chest pain, palpitations, leg swelling, orthopnea, and paroxysmal nocturnal dyspnea.  Chronic exertional dyspnea is stable.  He has been having tingling and numbness at the bottom of his feet and they stay cold.  He does have an aching sensation in both calves as well.  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    Review of Systems: As per "subjective", otherwise negative.  Allergies  Allergen Reactions  . Morphine And Related     MIGRAINE    Current Outpatient Medications  Medication Sig Dispense Refill  . ANORO ELLIPTA 62.5-25 MCG/INH AEPB Inhale 1 puff into the lungs daily.    Marland Kitchen aspirin 81 MG tablet Take 1 tablet (81 mg total) by mouth daily. 30 tablet   . lisinopril (PRINIVIL,ZESTRIL) 20 MG tablet Take 20 mg by mouth daily.    . metoprolol tartrate (LOPRESSOR) 25 MG tablet TAKE 1/2 TABLET TWICE DAILY 90 tablet 0  . nitroGLYCERIN (NITROSTAT) 0.4 MG SL tablet Place 1 tablet (0.4 mg total) under the tongue every 5 (five) minutes as needed for chest pain. 25 tablet 3  . ranitidine (ZANTAC) 75 MG tablet Take 75 mg by mouth 2 (two) times daily.    . simvastatin (ZOCOR) 40 MG tablet Take 40 mg by  mouth daily.      No current facility-administered medications for this visit.     Past Medical History:  Diagnosis Date  . Alcohol ingestion of more than four drinks per day    6 pack of beer per day  . Barrett esophagus   . CAD (coronary artery disease)   . Carotid artery disease ([place])    Status post bilateral carotid endarterectomies  . COPD (chronic obstructive pulmonary disease) ([place])   . Diabetes mellitus   . Dyslipidemia   . Ejection fraction   . GERD (gastroesophageal reflux disease)   . Hx of CABG    CABG, 2006  . Hypertension   . Ischemic cardiomyopathy    EF 35-40%, echo, 2011  . PAD (peripheral artery disease) ([place])    No  aortic aneurysm 2011  . Tobacco abuse     Past Surgical History:  Procedure Laterality Date  . BIOPSY  11/12/2016   Procedure: BIOPSY;  Surgeon: Rogene Houston, MD;  [facility];  Service: Endoscopy;;  gastric esophageal  . CARDIAC SURGERY    . CAROTID ARTERY ANGIOPLASTY     Bilateral  . CAROTID ENDARTERECTOMY  Jul 30, 2004   Right cea  . CAROTID ENDARTERECTOMY  Jan. 23, 2009   Left cea  . COLONOSCOPY    . COLONOSCOPY N/A 11/12/2016   Procedure: COLONOSCOPY;  Surgeon: Rogene Houston, MD;  [facility];  Service: Endoscopy;  Laterality: N/A;  . CORONARY ARTERY BYPASS  GRAFT     Dr. Clover Mealy  . ESOPHAGOGASTRODUODENOSCOPY N/A 11/12/2016   Procedure: ESOPHAGOGASTRODUODENOSCOPY (EGD);  Surgeon: Rogene Houston, MD;  [facility];  Service: Endoscopy;  Laterality: N/A;  1:00  . HERNIA REPAIR    . UPPER GASTROINTESTINAL ENDOSCOPY      Social History   Socioeconomic History  . Marital status: Married    Spouse name: Not on file  . Number of children: Not on file  . Years of education: Not on file  . Highest education level: Not on file  Occupational History  . Not on file  Social Needs  . Financial resource strain: Not on file  . Food insecurity:    Worry: Not on file    Inability: Not on file    . Transportation needs:    Medical: Not on file    Non-medical: Not on file  Tobacco Use  . Smoking status: Current Every Day Smoker    Packs/day: 3.00    Years: 52.00    Pack years: 156.00    Types: Cigarettes    Start date: 03/05/1958  . Smokeless tobacco: Never Used  . Tobacco comment: Patient smokes 3 packs a day  Substance and Sexual Activity  . Alcohol use: Yes    Alcohol/week: 0.0 standard drinks    Comment: Patient states that he drinks a 6 pack a day  . Drug use: No  . Sexual activity: Not on file  Lifestyle  . Physical activity:    Days per week: Not on file    Minutes per session: Not on file  . Stress: Not on file  Relationships  . Social connections:    Talks on phone: Not on file    Gets together: Not on file    Attends religious service: Not on file    Active member of club or organization: Not on file    Attends meetings of clubs or organizations: Not on file    Relationship status: Not on file  . Intimate partner violence:    Fear of current or ex partner: Not on file    Emotionally abused: Not on file    Physically abused: Not on file    Forced sexual activity: Not on file  Other Topics Concern  . Not on file  Social History Narrative  . Not on file     Vitals:   04/20/18 1042  BP: (!) 159/79  Pulse: 68  SpO2: 100%  Weight: 139 lb 3.2 oz (63.1 kg)  Height: 5\' 9"  (1.753 m)    Wt Readings from Last 3 Encounters:  04/20/18 139 lb 3.2 oz (63.1 kg)  03/22/17 137 lb (62.1 kg)  11/12/16 136 lb (61.7 kg)     PHYSICAL EXAM General: NAD HEENT: Normal. Neck: No JVD, no thyromegaly. Lungs: Diminished sounds throughout, no crackles or wheezes. CV: Regular rate and rhythm, normal S1/S2, no S3/S4, no murmur. No pretibial or periankle edema.  No carotid bruit.   Abdomen: Soft, nontender, no distention.  Neurologic: Alert and oriented.  Psych: Normal affect. Skin: Normal. Musculoskeletal: No gross deformities.    ECG: Reviewed above under  Subjective   Labs: Lab Results  Component Value Date/Time   K 3.9 04/23/2007 04:00 AM   BUN 5 (L) 04/23/2007 04:00 AM   CREATININE 0.62 04/23/2007 04:00 AM   ALT 16 04/19/2007 11:16 AM   HGB 14.0 08/13/2016 10:41 AM     Lipids: No results found for: LDLCALC, LDLDIRECT, CHOL, TRIG, HDL  ASSESSMENT AND PLAN: 1. CAD with CABG (2006): Symptomatically stable. Continue ASA, metoprolol, lisinopril, and simvastatin.   As it has been several years since CABG, I will obtain a Lexiscan Myoview for a surveillance ischemic evaluation.  2. Essential HTN: Controlled. No changes.  3. Hyperlipidemia: Continue simvastatin.  I will obtain a copy of lipids from PCP for personal review.  4. PVD with h/o bilateral carotid endarterectomies: Continue ASA and statin.  Most recent Dopplers reviewed above.  I will obtain follow-up Doppler study.  5.  Tobacco abuse: He smokes 3 packs of cigarettes daily.  He started smoking at the age of 58.  He has tried quitting before and has tried Chantix and nicotine patches without success.  6.  Bilateral calf pain/cold feet: Given his history of coronary artery disease and carotid artery disease along with longstanding tobacco abuse, he is at high risk for lower extremity peripheral arterial disease.  I will obtain ABIs.    Disposition: Follow up 1 year.   Kate Sable, M.D., F.A.C.C.

## 2018-04-20 NOTE — Telephone Encounter (Signed)
Pre-cert Verification for the following procedure   Lexiscan scheduled for 04/28/2018 at [facility]

## 2018-04-20 NOTE — Patient Instructions (Signed)
Your physician wants you to follow-up in: [name] will receive a reminder letter in the mail two months in advance. If you don't receive a letter, please call our office to schedule the follow-up appointment.  Your physician recommends that you continue on your current medications as directed. Please refer to the Current Medication list given to you today.  Your physician has requested that you have a lexiscan myoview. For further information please visit [email or web address]. Please follow instruction sheet, as given.  Your physician has requested that you have a carotid duplex. This test is an ultrasound of the carotid arteries in your neck. It looks at blood flow through these arteries that supply the brain with blood. Allow one hour for this exam. There are no restrictions or special instructions.  Your physician has requested that you have an ankle brachial index (ABI). During this test an ultrasound and blood pressure cuff are used to evaluate the arteries that supply the arms and legs with blood. Allow thirty minutes for this exam. There are no restrictions or special instructions.  Thank you for choosing [name]!!

## 2018-04-21 ENCOUNTER — Other Ambulatory Visit: Payer: Self-pay | Admitting: Cardiovascular Disease

## 2018-04-21 DIAGNOSIS — I6523 Occlusion and stenosis of bilateral carotid arteries: Secondary | ICD-10-CM

## 2018-04-21 DIAGNOSIS — I739 Peripheral vascular disease, unspecified: Secondary | ICD-10-CM

## 2018-04-28 ENCOUNTER — Encounter (HOSPITAL_COMMUNITY): Payer: Medicare PPO

## 2018-04-28 ENCOUNTER — Ambulatory Visit (HOSPITAL_COMMUNITY)
Admission: RE | Admit: 2018-04-28 | Discharge: 2018-04-28 | Disposition: A | Discharge status: Home / Self Care | Payer: Medicare PPO | Source: Ambulatory Visit | Attending: Cardiovascular Disease | Admitting: Cardiovascular Disease

## 2018-04-28 ENCOUNTER — Telehealth: Payer: Self-pay | Admitting: *Deleted

## 2018-04-28 ENCOUNTER — Encounter (HOSPITAL_COMMUNITY): Payer: Self-pay

## 2018-04-28 DIAGNOSIS — Z951 Presence of aortocoronary bypass graft: Secondary | ICD-10-CM | POA: Insufficient documentation

## 2018-04-28 LAB — NM MYOCAR MULTI W/SPECT W/WALL MOTION / EF
CHL CUP NUCLEAR SSS: 0
LV dias vol: 52 mL (ref 62–150)
LV sys vol: 16 mL
NUC STRESS TID: 0.83
Peak HR: 104 {beats}/min
RATE: 0.48
Rest HR: 71 {beats}/min
SDS: 0
SRS: 0

## 2018-04-28 MED ORDER — REGADENOSON 0.4 MG/5ML IV SOLN
INTRAVENOUS | Status: AC
  Administered 2018-04-28: 0.4 mg via INTRAVENOUS
  Filled 2018-04-28: qty 5

## 2018-04-28 MED ORDER — TECHNETIUM TC 99M TETROFOSMIN IV KIT
10.0000 | PACK | Freq: Once | INTRAVENOUS | Status: AC | PRN
  Administered 2018-04-28: 10.64 via INTRAVENOUS

## 2018-04-28 MED ORDER — TECHNETIUM TC 99M TETROFOSMIN IV KIT
30.0000 | PACK | Freq: Once | INTRAVENOUS | Status: AC | PRN
  Administered 2018-04-28: 30 via INTRAVENOUS

## 2018-04-28 MED ORDER — SODIUM CHLORIDE 0.9% FLUSH
INTRAVENOUS | Status: AC
  Administered 2018-04-28: 10 mL via INTRAVENOUS
  Filled 2018-04-28: qty 10

## 2018-04-28 NOTE — Telephone Encounter (Signed)
Notes recorded by Laurine Blazer, LPN on 7/95/5831 at 6:74 PM EST Patient notified. Copy to pmd. ------  Notes recorded by Herminio Commons, MD on 04/28/2018 at 11:39 AM EST Normal.

## 2018-05-05 ENCOUNTER — Telehealth: Payer: Self-pay | Admitting: *Deleted

## 2018-05-05 ENCOUNTER — Ambulatory Visit: Payer: Medicare PPO

## 2018-05-05 ENCOUNTER — Ambulatory Visit (INDEPENDENT_AMBULATORY_CARE_PROVIDER_SITE_OTHER): Payer: Medicare PPO

## 2018-05-05 DIAGNOSIS — I739 Peripheral vascular disease, unspecified: Secondary | ICD-10-CM | POA: Diagnosis not present

## 2018-05-05 DIAGNOSIS — I6523 Occlusion and stenosis of bilateral carotid arteries: Secondary | ICD-10-CM

## 2018-05-05 NOTE — Telephone Encounter (Signed)
CAROTID DOPPLER -  Notes recorded by Herminio Commons, MD on 05/05/2018 at 11:53 AM EST Very mild blockages bilaterally. Can repeat study in 1 year.   LE DOPPLER -  Notes recorded by Herminio Commons, MD on 05/05/2018 at 11:54 AM EST No significant blockages of the large vessels in the legs.

## 2018-05-05 NOTE — Telephone Encounter (Signed)
Notes recorded by Laurine Blazer, LPN on 04/02/3886 at 7:57 PM EST Patient notified. Copy to pmd.

## 2018-05-31 DIAGNOSIS — E871 Hypo-osmolality and hyponatremia: Secondary | ICD-10-CM | POA: Diagnosis not present

## 2018-05-31 DIAGNOSIS — D649 Anemia, unspecified: Secondary | ICD-10-CM | POA: Diagnosis not present

## 2018-05-31 DIAGNOSIS — E875 Hyperkalemia: Secondary | ICD-10-CM | POA: Diagnosis not present

## 2018-05-31 DIAGNOSIS — I10 Essential (primary) hypertension: Secondary | ICD-10-CM | POA: Diagnosis not present

## 2018-05-31 DIAGNOSIS — R42 Dizziness and giddiness: Secondary | ICD-10-CM | POA: Diagnosis not present

## 2018-05-31 DIAGNOSIS — K210 Gastro-esophageal reflux disease with esophagitis: Secondary | ICD-10-CM | POA: Diagnosis not present

## 2018-05-31 DIAGNOSIS — D692 Other nonthrombocytopenic purpura: Secondary | ICD-10-CM | POA: Diagnosis not present

## 2018-05-31 DIAGNOSIS — D519 Vitamin B12 deficiency anemia, unspecified: Secondary | ICD-10-CM | POA: Diagnosis not present

## 2018-05-31 DIAGNOSIS — E782 Mixed hyperlipidemia: Secondary | ICD-10-CM | POA: Diagnosis not present

## 2018-05-31 DIAGNOSIS — E039 Hypothyroidism, unspecified: Secondary | ICD-10-CM | POA: Diagnosis not present

## 2018-05-31 DIAGNOSIS — E1165 Type 2 diabetes mellitus with hyperglycemia: Secondary | ICD-10-CM | POA: Diagnosis not present

## 2018-05-31 DIAGNOSIS — N189 Chronic kidney disease, unspecified: Secondary | ICD-10-CM | POA: Diagnosis not present

## 2018-06-03 DIAGNOSIS — Z23 Encounter for immunization: Secondary | ICD-10-CM | POA: Diagnosis not present

## 2018-06-03 DIAGNOSIS — Z6821 Body mass index (BMI) 21.0-21.9, adult: Secondary | ICD-10-CM | POA: Diagnosis not present

## 2018-06-03 DIAGNOSIS — K219 Gastro-esophageal reflux disease without esophagitis: Secondary | ICD-10-CM | POA: Diagnosis not present

## 2018-06-03 DIAGNOSIS — I10 Essential (primary) hypertension: Secondary | ICD-10-CM | POA: Diagnosis not present

## 2018-06-03 DIAGNOSIS — J309 Allergic rhinitis, unspecified: Secondary | ICD-10-CM | POA: Diagnosis not present

## 2018-06-03 DIAGNOSIS — R202 Paresthesia of skin: Secondary | ICD-10-CM | POA: Diagnosis not present

## 2018-06-03 DIAGNOSIS — J449 Chronic obstructive pulmonary disease, unspecified: Secondary | ICD-10-CM | POA: Diagnosis not present

## 2018-06-03 DIAGNOSIS — E1159 Type 2 diabetes mellitus with other circulatory complications: Secondary | ICD-10-CM | POA: Diagnosis not present

## 2018-09-02 ENCOUNTER — Other Ambulatory Visit: Payer: Self-pay | Admitting: Cardiovascular Disease

## 2018-09-21 ENCOUNTER — Other Ambulatory Visit: Payer: Self-pay | Admitting: Cardiovascular Disease

## 2018-09-21 DIAGNOSIS — I6523 Occlusion and stenosis of bilateral carotid arteries: Secondary | ICD-10-CM

## 2018-09-27 DIAGNOSIS — N189 Chronic kidney disease, unspecified: Secondary | ICD-10-CM | POA: Diagnosis not present

## 2018-09-27 DIAGNOSIS — D500 Iron deficiency anemia secondary to blood loss (chronic): Secondary | ICD-10-CM | POA: Diagnosis not present

## 2018-09-27 DIAGNOSIS — E871 Hypo-osmolality and hyponatremia: Secondary | ICD-10-CM | POA: Diagnosis not present

## 2018-09-27 DIAGNOSIS — K210 Gastro-esophageal reflux disease with esophagitis: Secondary | ICD-10-CM | POA: Diagnosis not present

## 2018-09-27 DIAGNOSIS — D649 Anemia, unspecified: Secondary | ICD-10-CM | POA: Diagnosis not present

## 2018-09-27 DIAGNOSIS — E1165 Type 2 diabetes mellitus with hyperglycemia: Secondary | ICD-10-CM | POA: Diagnosis not present

## 2018-09-27 DIAGNOSIS — E039 Hypothyroidism, unspecified: Secondary | ICD-10-CM | POA: Diagnosis not present

## 2018-09-27 DIAGNOSIS — I10 Essential (primary) hypertension: Secondary | ICD-10-CM | POA: Diagnosis not present

## 2018-09-27 DIAGNOSIS — D529 Folate deficiency anemia, unspecified: Secondary | ICD-10-CM | POA: Diagnosis not present

## 2018-09-30 DIAGNOSIS — I10 Essential (primary) hypertension: Secondary | ICD-10-CM | POA: Diagnosis not present

## 2018-09-30 DIAGNOSIS — J449 Chronic obstructive pulmonary disease, unspecified: Secondary | ICD-10-CM | POA: Diagnosis not present

## 2018-09-30 DIAGNOSIS — D692 Other nonthrombocytopenic purpura: Secondary | ICD-10-CM | POA: Diagnosis not present

## 2018-09-30 DIAGNOSIS — D649 Anemia, unspecified: Secondary | ICD-10-CM | POA: Diagnosis not present

## 2018-09-30 DIAGNOSIS — E782 Mixed hyperlipidemia: Secondary | ICD-10-CM | POA: Diagnosis not present

## 2018-09-30 DIAGNOSIS — I708 Atherosclerosis of other arteries: Secondary | ICD-10-CM | POA: Diagnosis not present

## 2018-09-30 DIAGNOSIS — Z6821 Body mass index (BMI) 21.0-21.9, adult: Secondary | ICD-10-CM | POA: Diagnosis not present

## 2018-09-30 DIAGNOSIS — E1159 Type 2 diabetes mellitus with other circulatory complications: Secondary | ICD-10-CM | POA: Diagnosis not present

## 2018-10-03 DIAGNOSIS — E871 Hypo-osmolality and hyponatremia: Secondary | ICD-10-CM | POA: Diagnosis not present

## 2018-10-18 DIAGNOSIS — D649 Anemia, unspecified: Secondary | ICD-10-CM | POA: Diagnosis not present

## 2018-10-18 DIAGNOSIS — E871 Hypo-osmolality and hyponatremia: Secondary | ICD-10-CM | POA: Diagnosis not present

## 2018-11-28 DIAGNOSIS — E785 Hyperlipidemia, unspecified: Secondary | ICD-10-CM | POA: Diagnosis not present

## 2018-11-28 DIAGNOSIS — I10 Essential (primary) hypertension: Secondary | ICD-10-CM | POA: Diagnosis not present

## 2019-01-26 DIAGNOSIS — E1159 Type 2 diabetes mellitus with other circulatory complications: Secondary | ICD-10-CM | POA: Diagnosis not present

## 2019-01-26 DIAGNOSIS — E871 Hypo-osmolality and hyponatremia: Secondary | ICD-10-CM | POA: Diagnosis not present

## 2019-01-26 DIAGNOSIS — D649 Anemia, unspecified: Secondary | ICD-10-CM | POA: Diagnosis not present

## 2019-01-26 DIAGNOSIS — K219 Gastro-esophageal reflux disease without esophagitis: Secondary | ICD-10-CM | POA: Diagnosis not present

## 2019-01-26 DIAGNOSIS — I10 Essential (primary) hypertension: Secondary | ICD-10-CM | POA: Diagnosis not present

## 2019-01-26 DIAGNOSIS — D500 Iron deficiency anemia secondary to blood loss (chronic): Secondary | ICD-10-CM | POA: Diagnosis not present

## 2019-01-26 DIAGNOSIS — J449 Chronic obstructive pulmonary disease, unspecified: Secondary | ICD-10-CM | POA: Diagnosis not present

## 2019-01-26 DIAGNOSIS — E1165 Type 2 diabetes mellitus with hyperglycemia: Secondary | ICD-10-CM | POA: Diagnosis not present

## 2019-01-26 DIAGNOSIS — D519 Vitamin B12 deficiency anemia, unspecified: Secondary | ICD-10-CM | POA: Diagnosis not present

## 2019-01-26 DIAGNOSIS — N189 Chronic kidney disease, unspecified: Secondary | ICD-10-CM | POA: Diagnosis not present

## 2019-01-31 DIAGNOSIS — Z23 Encounter for immunization: Secondary | ICD-10-CM | POA: Diagnosis not present

## 2019-01-31 DIAGNOSIS — E1159 Type 2 diabetes mellitus with other circulatory complications: Secondary | ICD-10-CM | POA: Diagnosis not present

## 2019-01-31 DIAGNOSIS — I708 Atherosclerosis of other arteries: Secondary | ICD-10-CM | POA: Diagnosis not present

## 2019-01-31 DIAGNOSIS — I10 Essential (primary) hypertension: Secondary | ICD-10-CM | POA: Diagnosis not present

## 2019-01-31 DIAGNOSIS — J449 Chronic obstructive pulmonary disease, unspecified: Secondary | ICD-10-CM | POA: Diagnosis not present

## 2019-01-31 DIAGNOSIS — Z6822 Body mass index (BMI) 22.0-22.9, adult: Secondary | ICD-10-CM | POA: Diagnosis not present

## 2019-01-31 DIAGNOSIS — D649 Anemia, unspecified: Secondary | ICD-10-CM | POA: Diagnosis not present

## 2019-01-31 DIAGNOSIS — R202 Paresthesia of skin: Secondary | ICD-10-CM | POA: Diagnosis not present

## 2019-04-26 ENCOUNTER — Other Ambulatory Visit: Payer: Self-pay | Admitting: Cardiovascular Disease

## 2019-06-05 DIAGNOSIS — E039 Hypothyroidism, unspecified: Secondary | ICD-10-CM | POA: Diagnosis not present

## 2019-06-05 DIAGNOSIS — I10 Essential (primary) hypertension: Secondary | ICD-10-CM | POA: Diagnosis not present

## 2019-06-05 DIAGNOSIS — K219 Gastro-esophageal reflux disease without esophagitis: Secondary | ICD-10-CM | POA: Diagnosis not present

## 2019-06-05 DIAGNOSIS — E1159 Type 2 diabetes mellitus with other circulatory complications: Secondary | ICD-10-CM | POA: Diagnosis not present

## 2019-06-05 DIAGNOSIS — E1165 Type 2 diabetes mellitus with hyperglycemia: Secondary | ICD-10-CM | POA: Diagnosis not present

## 2019-06-05 DIAGNOSIS — E782 Mixed hyperlipidemia: Secondary | ICD-10-CM | POA: Diagnosis not present

## 2019-06-09 DIAGNOSIS — Z6821 Body mass index (BMI) 21.0-21.9, adult: Secondary | ICD-10-CM | POA: Diagnosis not present

## 2019-06-09 DIAGNOSIS — I708 Atherosclerosis of other arteries: Secondary | ICD-10-CM | POA: Diagnosis not present

## 2019-06-09 DIAGNOSIS — E1159 Type 2 diabetes mellitus with other circulatory complications: Secondary | ICD-10-CM | POA: Diagnosis not present

## 2019-06-09 DIAGNOSIS — R202 Paresthesia of skin: Secondary | ICD-10-CM | POA: Diagnosis not present

## 2019-06-09 DIAGNOSIS — J449 Chronic obstructive pulmonary disease, unspecified: Secondary | ICD-10-CM | POA: Diagnosis not present

## 2019-06-09 DIAGNOSIS — I10 Essential (primary) hypertension: Secondary | ICD-10-CM | POA: Diagnosis not present

## 2019-06-09 DIAGNOSIS — D649 Anemia, unspecified: Secondary | ICD-10-CM | POA: Diagnosis not present

## 2019-06-09 DIAGNOSIS — E871 Hypo-osmolality and hyponatremia: Secondary | ICD-10-CM | POA: Diagnosis not present

## 2019-06-22 ENCOUNTER — Ambulatory Visit: Payer: Medicare PPO | Admitting: Cardiovascular Disease

## 2019-06-23 ENCOUNTER — Ambulatory Visit: Payer: Medicare PPO | Admitting: Cardiovascular Disease

## 2019-07-07 ENCOUNTER — Encounter: Payer: Self-pay | Admitting: Cardiovascular Disease

## 2019-07-07 ENCOUNTER — Ambulatory Visit: Payer: Medicare PPO | Admitting: Cardiovascular Disease

## 2019-07-07 ENCOUNTER — Other Ambulatory Visit: Payer: Self-pay

## 2019-07-07 VITALS — BP 112/54 | HR 70 | Ht 69.0 in | Wt 138.0 lb

## 2019-07-07 DIAGNOSIS — I10 Essential (primary) hypertension: Secondary | ICD-10-CM | POA: Diagnosis not present

## 2019-07-07 DIAGNOSIS — M79605 Pain in left leg: Secondary | ICD-10-CM | POA: Diagnosis not present

## 2019-07-07 DIAGNOSIS — Z951 Presence of aortocoronary bypass graft: Secondary | ICD-10-CM | POA: Diagnosis not present

## 2019-07-07 DIAGNOSIS — M79604 Pain in right leg: Secondary | ICD-10-CM | POA: Diagnosis not present

## 2019-07-07 DIAGNOSIS — E785 Hyperlipidemia, unspecified: Secondary | ICD-10-CM

## 2019-07-07 DIAGNOSIS — Z720 Tobacco use: Secondary | ICD-10-CM | POA: Diagnosis not present

## 2019-07-07 DIAGNOSIS — Z9889 Other specified postprocedural states: Secondary | ICD-10-CM | POA: Diagnosis not present

## 2019-07-07 DIAGNOSIS — I25708 Atherosclerosis of coronary artery bypass graft(s), unspecified, with other forms of angina pectoris: Secondary | ICD-10-CM | POA: Diagnosis not present

## 2019-07-07 NOTE — Patient Instructions (Signed)

## 2019-07-07 NOTE — Progress Notes (Signed)
SUBJECTIVE: The patient presents for routine follow-up. He has a history of coronary artery disease and CABG in 2006. He also has a history of hypertension, dyslipidemia, and tobacco abuse.  Echocardiogram on 05/11/12 demonstrated normal left ventricular systolic function and regional wall motion, EF 55%. Diastolic dysfunction was present.  He has a history of bilateral carotid endarterectomies with Dopplers on 05/28/14 demonstrating less than 40% stenosis bilaterally.  He smokes 3 packs of cigarettes daily.  He started smoking at the age of 12.  He has tried quitting before and has tried Chantix and nicotine patches without success.  Chronic exertional dyspnea stable.  He denies exertional chest pain.  He continues to complain of neuropathic pain on the bottom of his feet.  He underwent a normal nuclear stress test on 04/28/2018.  ABIs on 05/06/2018 were normal at rest in both lower extremities.  There is no evidence of significant lower extremity arterial disease.  He told me he is scheduled to see vascular surgery on 07/28/2019 and is also scheduled for repeat carotid Dopplers and ABIs, both of which were performed and were found to be unremarkable last year.  ECG performed today which appears review demonstrates sinus rhythm with incomplete right bundle branch block and old septal infarct.  Review of Systems: As per "subjective", otherwise negative.  Allergies  Allergen Reactions  . Morphine And Related     MIGRAINE    Current Outpatient Medications  Medication Sig Dispense Refill  . aspirin 81 MG tablet Take 1 tablet (81 mg total) by mouth daily. 30 tablet   . lisinopril (PRINIVIL,ZESTRIL) 20 MG tablet Take 20 mg by mouth daily.    . metoprolol tartrate (LOPRESSOR) 25 MG tablet TAKE 1/2 TABLET TWICE DAILY 90 tablet 0  . nitroGLYCERIN (NITROSTAT) 0.4 MG SL tablet Place 1 tablet (0.4 mg total) under the tongue every 5 (five) minutes as needed for chest pain. 25 tablet 3  .  simvastatin (ZOCOR) 40 MG tablet Take 40 mg by mouth daily.      No current facility-administered medications for this visit.    Past Medical History:  Diagnosis Date  . Alcohol ingestion of more than four drinks per day    6 pack of beer per day  . Barrett esophagus   . CAD (coronary artery disease)   . Carotid artery disease ([place])    Status post bilateral carotid endarterectomies  . COPD (chronic obstructive pulmonary disease) ([place])   . Diabetes mellitus   . Dyslipidemia   . Ejection fraction   . GERD (gastroesophageal reflux disease)   . Hx of CABG    CABG, 2006  . Hypertension   . Ischemic cardiomyopathy    EF 35-40%, echo, 2011  . PAD (peripheral artery disease) ([place])    No  aortic aneurysm 2011  . Tobacco abuse     Past Surgical History:  Procedure Laterality Date  . BIOPSY  11/12/2016   Procedure: BIOPSY;  Surgeon: Rogene Houston, MD;  [facility];  Service: Endoscopy;;  gastric esophageal  . CARDIAC SURGERY    . CAROTID ARTERY ANGIOPLASTY     Bilateral  . CAROTID ENDARTERECTOMY  Jul 30, 2004   Right cea  . CAROTID ENDARTERECTOMY  Jan. 23, 2009   Left cea  . COLONOSCOPY    . COLONOSCOPY N/A 11/12/2016   Procedure: COLONOSCOPY;  Surgeon: Rogene Houston, MD;  [facility];  Service: Endoscopy;  Laterality: N/A;  . CORONARY ARTERY BYPASS  GRAFT     Dr. Clover Mealy  . ESOPHAGOGASTRODUODENOSCOPY N/A 11/12/2016   Procedure: ESOPHAGOGASTRODUODENOSCOPY (EGD);  Surgeon: Rogene Houston, MD;  [facility];  Service: Endoscopy;  Laterality: N/A;  1:00  . HERNIA REPAIR    . UPPER GASTROINTESTINAL ENDOSCOPY      Social History   Socioeconomic History  . Marital status: Married    Spouse name: Not on file  . Number of children: Not on file  . Years of education: Not on file  . Highest education level: Not on file  Occupational History  . Not on file  Tobacco Use  . Smoking status: Current Every Day Smoker    Packs/day: 3.00     Years: 52.00    Pack years: 156.00    Types: Cigarettes    Start date: 03/05/1958  . Smokeless tobacco: Never Used  . Tobacco comment: Patient smokes 3 packs a day  Substance and Sexual Activity  . Alcohol use: Yes    Alcohol/week: 0.0 standard drinks    Comment: Patient states that he drinks a 6 pack a day  . Drug use: No  . Sexual activity: Not on file  Other Topics Concern  . Not on file  Social History Narrative  . Not on file   Social Determinants of Health   Financial Resource Strain:   . Difficulty of Paying Living Expenses:   Food Insecurity:   . Worried About Charity fundraiser in the Last Year:   . Arboriculturist in the Last Year:   Transportation Needs:   . Film/video editor (Medical):   Marland Kitchen Lack of Transportation (Non-Medical):   Physical Activity:   . Days of Exercise per Week:   . Minutes of Exercise per Session:   Stress:   . Feeling of Stress :   Social Connections:   . Frequency of Communication with Friends and Family:   . Frequency of Social Gatherings with Friends and Family:   . Attends Religious Services:   . Active Member of Clubs or Organizations:   . Attends Archivist Meetings:   Marland Kitchen Marital Status:   Intimate Partner Violence:   . Fear of Current or Ex-Partner:   . Emotionally Abused:   Marland Kitchen Physically Abused:   . Sexually Abused:      Vitals:   07/07/19 0843  BP: (!) 112/54  Pulse: 70  SpO2: 97%  Weight: 138 lb (62.6 kg)  Height: 5\' 9"  (1.753 m)    Wt Readings from Last 3 Encounters:  07/07/19 138 lb (62.6 kg)  04/20/18 139 lb 3.2 oz (63.1 kg)  03/22/17 137 lb (62.1 kg)     PHYSICAL EXAM General: NAD HEENT: Normal. Neck: No JVD, no thyromegaly. Lungs: Diminished sounds throughout, no crackles or wheezes. CV: Regular rate and rhythm, normal S1/S2, no S3/S4, no murmur. No pretibial or periankle edema.  No carotid bruit.   Abdomen: Soft, nontender, no distention.  Neurologic: Alert and oriented.  Psych: Normal  affect. Skin: Normal. Musculoskeletal: No gross deformities.    ECG: Reviewed above under Subjective   Labs: Lab Results  Component Value Date/Time   K 3.9 04/23/2007 04:00 AM   BUN 5 (L) 04/23/2007 04:00 AM   CREATININE 0.62 04/23/2007 04:00 AM   ALT 16 04/19/2007 11:16 AM   HGB 14.0 08/13/2016 10:41 AM     Lipids: No results found for: LDLCALC, LDLDIRECT, CHOL, TRIG, HDL     ASSESSMENT AND PLAN: 1. CAD with CABG (  2006): Symptomatically stable. Continue ASA, metoprolol, lisinopril, and simvastatin.   He underwent a normal nuclear stress test on 04/28/2018.  2. Essential HTN: Controlled. No changes.  3. Hyperlipidemia: Continue simvastatin.   Goal LDL less than 70.  4. PVD with h/o bilateral carotid endarterectomies: Continue ASA and statin.  Carotid Dopplers on 05/05/2018 showed 1 to 39% bilateral ICA stenosis.  He has been scheduled by his PCP for repeat carotid Dopplers on 07/28/2019.  5.  Tobacco abuse: He smokes 3 packs of cigarettes daily.  He started smoking at the age of 28.  He has tried quitting before and has tried Chantix and nicotine patches without success.  6.  Bilateral calf pain/cold feet: ABIs on 05/06/2018 were normal at rest in both lower extremities.  There is no evidence of significant lower extremity arterial disease.  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  Nonetheless, he has been scheduled see vascular surgery by his PCP.    Disposition: Follow up 1 year.   Kate Sable, M.D., F.A.C.C.

## 2019-07-26 ENCOUNTER — Other Ambulatory Visit: Payer: Self-pay | Admitting: *Deleted

## 2019-07-26 DIAGNOSIS — I6523 Occlusion and stenosis of bilateral carotid arteries: Secondary | ICD-10-CM

## 2019-07-26 DIAGNOSIS — I739 Peripheral vascular disease, unspecified: Secondary | ICD-10-CM

## 2019-07-28 ENCOUNTER — Encounter: Payer: Self-pay | Admitting: Vascular Surgery

## 2019-07-28 ENCOUNTER — Ambulatory Visit (HOSPITAL_COMMUNITY)
Admission: RE | Admit: 2019-07-28 | Discharge: 2019-07-28 | Disposition: A | Discharge status: Home / Self Care | Payer: Medicare PPO | Source: Ambulatory Visit | Attending: Vascular Surgery | Admitting: Vascular Surgery

## 2019-07-28 ENCOUNTER — Ambulatory Visit (INDEPENDENT_AMBULATORY_CARE_PROVIDER_SITE_OTHER)
Admission: RE | Admit: 2019-07-28 | Discharge: 2019-07-28 | Disposition: A | Discharge status: Home / Self Care | Payer: Medicare PPO | Source: Ambulatory Visit | Attending: Vascular Surgery | Admitting: Vascular Surgery

## 2019-07-28 ENCOUNTER — Other Ambulatory Visit: Payer: Self-pay

## 2019-07-28 ENCOUNTER — Ambulatory Visit (INDEPENDENT_AMBULATORY_CARE_PROVIDER_SITE_OTHER): Payer: Medicare PPO | Admitting: Vascular Surgery

## 2019-07-28 VITALS — BP 100/59 | HR 66 | Temp 97.5°F | Resp 16 | Ht 69.0 in | Wt 138.0 lb

## 2019-07-28 DIAGNOSIS — I739 Peripheral vascular disease, unspecified: Secondary | ICD-10-CM

## 2019-07-28 DIAGNOSIS — I6523 Occlusion and stenosis of bilateral carotid arteries: Secondary | ICD-10-CM | POA: Diagnosis not present

## 2019-07-28 NOTE — Progress Notes (Signed)
Patient ID: Benjamin [name], male   DOB: [birth year], 69 y.o.   MRN: [id number]  Reason for Consult: New Patient (Initial Visit) (abnormal brachial index right foot)   Referred by Jalene Mullet, PA-C  Subjective:     HPI:  Benjamin [name] is a 69 y.o. male history of bilateral carotid endarterectomies the right side 2006 left on 2009.  He is followed by Dr. Bronson Ing for his coronary artery disease with previous CABG in 2006.  He now follows up for bilateral lower extremity pain.  He does continue to smoke greater than 1 pack/day states that he quit once does not care to the quit at this time.  Denies any history of TIA or amaurosis.  Leg pain is burning mostly in his feet.  Denies tissue loss or ulceration.  Does not have any history of lower extremity vascular invention.  Past Medical History:  Diagnosis Date  . Alcohol ingestion of more than four drinks per day    6 pack of beer per day  . Barrett esophagus   . CAD (coronary artery disease)   . Carotid artery disease ([place])    Status post bilateral carotid endarterectomies  . COPD (chronic obstructive pulmonary disease) ([place])   . Diabetes mellitus   . Dyslipidemia   . Ejection fraction   . GERD (gastroesophageal reflux disease)   . Hx of CABG    CABG, 2006  . Hypertension   . Ischemic cardiomyopathy    EF 35-40%, echo, 2011  . PAD (peripheral artery disease) ([place])    No  aortic aneurysm 2011  . Tobacco abuse    Family History  Problem Relation Age of Onset  . Heart disease Father   . Hypertension Father   . Heart attack Father   . Rheum arthritis Sister   . Hypertension Sister   . Healthy Daughter   . Healthy Son   . Healthy Son   . Cancer Mother   . Hypertension Mother    Past Surgical History:  Procedure Laterality Date  . BIOPSY  11/12/2016   Procedure: BIOPSY;  Surgeon: Rogene Houston, MD;  [facility];  Service: Endoscopy;;  gastric esophageal  . CARDIAC SURGERY    . CAROTID ARTERY ANGIOPLASTY      Bilateral  . CAROTID ENDARTERECTOMY  Jul 30, 2004   Right cea  . CAROTID ENDARTERECTOMY  Jan. 23, 2009   Left cea  . COLONOSCOPY    . COLONOSCOPY N/A 11/12/2016   Procedure: COLONOSCOPY;  Surgeon: Rogene Houston, MD;  [facility];  Service: Endoscopy;  Laterality: N/A;  . CORONARY ARTERY BYPASS GRAFT     Dr. Clover Mealy  . ESOPHAGOGASTRODUODENOSCOPY N/A 11/12/2016   Procedure: ESOPHAGOGASTRODUODENOSCOPY (EGD);  Surgeon: Rogene Houston, MD;  [facility];  Service: Endoscopy;  Laterality: N/A;  1:00  . HERNIA REPAIR    . UPPER GASTROINTESTINAL ENDOSCOPY      Short Social History:  Social History   Tobacco Use  . Smoking status: Current Every Day Smoker    Packs/day: 3.00    Years: 52.00    Pack years: 156.00    Types: Cigarettes    Start date: 03/05/1958  . Smokeless tobacco: Never Used  . Tobacco comment: Patient smokes 3 packs a day  Substance Use Topics  . Alcohol use: Yes    Alcohol/week: 0.0 standard drinks    Comment: Patient states that he drinks a 6 pack a day  Allergies  Allergen Reactions  . Morphine And Related     MIGRAINE    Current Outpatient Medications  Medication Sig Dispense Refill  . aspirin 81 MG tablet Take 1 tablet (81 mg total) by mouth daily. 30 tablet   . lisinopril (PRINIVIL,ZESTRIL) 20 MG tablet Take 20 mg by mouth daily.    . metoprolol tartrate (LOPRESSOR) 25 MG tablet TAKE 1/2 TABLET TWICE DAILY 90 tablet 0  . simvastatin (ZOCOR) 40 MG tablet Take 40 mg by mouth daily.     . nitroGLYCERIN (NITROSTAT) 0.4 MG SL tablet Place 1 tablet (0.4 mg total) under the tongue every 5 (five) minutes as needed for chest pain. 25 tablet 3   No current facility-administered medications for this visit.    Review of Systems  Constitutional:  Constitutional negative. HENT: HENT negative.  Respiratory: Respiratory negative.  Cardiovascular: Cardiovascular negative.  Musculoskeletal:       Bilateral foot pain Skin: Skin  negative.  Neurological: Neurological negative. Hematologic: Hematologic/lymphatic negative.  Psychiatric: Psychiatric negative.        Objective:  Objective   Vitals:   07/28/19 1053  BP: (!) 100/59  Pulse: 66  Resp: 16  Temp: (!) 97.5 F (36.4 C)  TempSrc: Temporal  SpO2: 97%  Weight: 138 lb (62.6 kg)  Height: 5\' 9"  (1.753 m)   Body mass index is 20.38 kg/m.  Physical Exam HENT:     Head: Normocephalic.     Nose:     Comments: Mask in place    Mouth/Throat:     Mouth: Mucous membranes are moist.  Eyes:     Pupils: Pupils are equal, round, and reactive to light.  Neck:     Vascular: No carotid bruit.     Comments: Well-healed bilateral scars Cardiovascular:     Pulses: Normal pulses.     Heart sounds: Normal heart sounds.  Pulmonary:     Effort: Pulmonary effort is normal.     Breath sounds: Normal breath sounds.  Abdominal:     General: Abdomen is flat.     Palpations: Abdomen is soft. There is no mass.  Musculoskeletal:        General: Swelling present. Normal range of motion.     Cervical back: Normal range of motion and neck supple.  Skin:    General: Skin is warm and dry.     Capillary Refill: Capillary refill takes less than 2 seconds.  Neurological:     General: No focal deficit present.     Mental Status: He is alert.  Psychiatric:        Mood and Affect: Mood normal.        Behavior: Behavior normal.        Thought Content: Thought content normal.        Judgment: Judgment normal.     Data: I have independent interpreted the ABIs to be 1.29 right and 0.99 left and triphasic bilaterally.  I have independently interpreted his bilateral carotid artery duplex to be 1 to 39% stenosis bilaterally.     Assessment/Plan:     69 year old male with previous bilateral carotid endarterectomies.  He is sent here for evaluation of bilateral lower extremity pain which appears to be neuropathic in nature.  He states that he has been diagnosed with  neuropathy in the past.  He does have palpable pulses distally ABIs are preserved.  I have counseled him on smoking cessation he appears to have no interest at this time.  He will  follow-up in 2 years with repeat carotid duplex.     Waynetta Sandy MD [facility]

## 2019-07-31 ENCOUNTER — Other Ambulatory Visit: Payer: Self-pay | Admitting: *Deleted

## 2019-07-31 DIAGNOSIS — I6523 Occlusion and stenosis of bilateral carotid arteries: Secondary | ICD-10-CM

## 2019-08-10 DIAGNOSIS — F17200 Nicotine dependence, unspecified, uncomplicated: Secondary | ICD-10-CM | POA: Diagnosis not present

## 2019-08-10 DIAGNOSIS — I10 Essential (primary) hypertension: Secondary | ICD-10-CM | POA: Diagnosis not present

## 2019-08-10 DIAGNOSIS — E785 Hyperlipidemia, unspecified: Secondary | ICD-10-CM | POA: Diagnosis not present

## 2019-08-10 DIAGNOSIS — M792 Neuralgia and neuritis, unspecified: Secondary | ICD-10-CM | POA: Diagnosis not present

## 2019-08-10 DIAGNOSIS — E871 Hypo-osmolality and hyponatremia: Secondary | ICD-10-CM | POA: Diagnosis not present

## 2019-08-10 DIAGNOSIS — E1142 Type 2 diabetes mellitus with diabetic polyneuropathy: Secondary | ICD-10-CM | POA: Diagnosis not present

## 2019-08-10 DIAGNOSIS — I2510 Atherosclerotic heart disease of native coronary artery without angina pectoris: Secondary | ICD-10-CM | POA: Diagnosis not present

## 2019-08-10 DIAGNOSIS — Z955 Presence of coronary angioplasty implant and graft: Secondary | ICD-10-CM | POA: Diagnosis not present

## 2019-08-10 DIAGNOSIS — G8929 Other chronic pain: Secondary | ICD-10-CM | POA: Diagnosis not present

## 2019-08-10 DIAGNOSIS — Z951 Presence of aortocoronary bypass graft: Secondary | ICD-10-CM | POA: Diagnosis not present

## 2019-08-16 DIAGNOSIS — J449 Chronic obstructive pulmonary disease, unspecified: Secondary | ICD-10-CM | POA: Diagnosis not present

## 2019-08-16 DIAGNOSIS — I10 Essential (primary) hypertension: Secondary | ICD-10-CM | POA: Diagnosis not present

## 2019-08-16 DIAGNOSIS — Z79899 Other long term (current) drug therapy: Secondary | ICD-10-CM | POA: Diagnosis not present

## 2019-08-16 DIAGNOSIS — G2581 Restless legs syndrome: Secondary | ICD-10-CM | POA: Diagnosis not present

## 2019-08-16 DIAGNOSIS — M130 Polyarthritis, unspecified: Secondary | ICD-10-CM | POA: Diagnosis not present

## 2019-08-16 DIAGNOSIS — M25561 Pain in right knee: Secondary | ICD-10-CM | POA: Diagnosis not present

## 2019-08-16 DIAGNOSIS — I2510 Atherosclerotic heart disease of native coronary artery without angina pectoris: Secondary | ICD-10-CM | POA: Diagnosis not present

## 2019-08-16 DIAGNOSIS — G629 Polyneuropathy, unspecified: Secondary | ICD-10-CM | POA: Diagnosis not present

## 2019-08-16 DIAGNOSIS — M25562 Pain in left knee: Secondary | ICD-10-CM | POA: Diagnosis not present

## 2019-08-16 DIAGNOSIS — E559 Vitamin D deficiency, unspecified: Secondary | ICD-10-CM | POA: Diagnosis not present

## 2019-08-16 DIAGNOSIS — E785 Hyperlipidemia, unspecified: Secondary | ICD-10-CM | POA: Diagnosis not present

## 2019-08-19 DIAGNOSIS — N179 Acute kidney failure, unspecified: Secondary | ICD-10-CM | POA: Diagnosis not present

## 2019-08-19 DIAGNOSIS — G629 Polyneuropathy, unspecified: Secondary | ICD-10-CM | POA: Diagnosis not present

## 2019-08-19 DIAGNOSIS — R42 Dizziness and giddiness: Secondary | ICD-10-CM | POA: Diagnosis not present

## 2019-08-19 DIAGNOSIS — R947 Abnormal results of other endocrine function studies: Secondary | ICD-10-CM | POA: Diagnosis not present

## 2019-08-19 DIAGNOSIS — F1020 Alcohol dependence, uncomplicated: Secondary | ICD-10-CM | POA: Diagnosis not present

## 2019-08-19 DIAGNOSIS — E278 Other specified disorders of adrenal gland: Secondary | ICD-10-CM | POA: Diagnosis not present

## 2019-08-19 DIAGNOSIS — E222 Syndrome of inappropriate secretion of antidiuretic hormone: Secondary | ICD-10-CM | POA: Diagnosis not present

## 2019-08-19 DIAGNOSIS — E279 Disorder of adrenal gland, unspecified: Secondary | ICD-10-CM | POA: Diagnosis not present

## 2019-08-19 DIAGNOSIS — R188 Other ascites: Secondary | ICD-10-CM | POA: Diagnosis not present

## 2019-08-19 DIAGNOSIS — I10 Essential (primary) hypertension: Secondary | ICD-10-CM | POA: Diagnosis not present

## 2019-08-19 DIAGNOSIS — Z20822 Contact with and (suspected) exposure to covid-19: Secondary | ICD-10-CM | POA: Diagnosis not present

## 2019-08-19 DIAGNOSIS — E43 Unspecified severe protein-calorie malnutrition: Secondary | ICD-10-CM | POA: Diagnosis not present

## 2019-08-19 DIAGNOSIS — E869 Volume depletion, unspecified: Secondary | ICD-10-CM | POA: Diagnosis not present

## 2019-08-19 DIAGNOSIS — R410 Disorientation, unspecified: Secondary | ICD-10-CM | POA: Diagnosis not present

## 2019-08-19 DIAGNOSIS — E119 Type 2 diabetes mellitus without complications: Secondary | ICD-10-CM | POA: Diagnosis not present

## 2019-08-19 DIAGNOSIS — R531 Weakness: Secondary | ICD-10-CM | POA: Diagnosis not present

## 2019-08-19 DIAGNOSIS — G6289 Other specified polyneuropathies: Secondary | ICD-10-CM | POA: Diagnosis not present

## 2019-08-19 DIAGNOSIS — K7030 Alcoholic cirrhosis of liver without ascites: Secondary | ICD-10-CM | POA: Diagnosis not present

## 2019-08-19 DIAGNOSIS — I2510 Atherosclerotic heart disease of native coronary artery without angina pectoris: Secondary | ICD-10-CM | POA: Diagnosis not present

## 2019-08-19 DIAGNOSIS — E871 Hypo-osmolality and hyponatremia: Secondary | ICD-10-CM | POA: Diagnosis not present

## 2019-08-19 DIAGNOSIS — R269 Unspecified abnormalities of gait and mobility: Secondary | ICD-10-CM | POA: Diagnosis not present

## 2019-08-19 DIAGNOSIS — R590 Localized enlarged lymph nodes: Secondary | ICD-10-CM | POA: Diagnosis not present

## 2019-08-19 DIAGNOSIS — R930 Abnormal findings on diagnostic imaging of skull and head, not elsewhere classified: Secondary | ICD-10-CM | POA: Diagnosis not present

## 2019-08-19 DIAGNOSIS — G9340 Encephalopathy, unspecified: Secondary | ICD-10-CM | POA: Diagnosis not present

## 2019-08-19 DIAGNOSIS — R160 Hepatomegaly, not elsewhere classified: Secondary | ICD-10-CM | POA: Diagnosis not present

## 2019-08-19 DIAGNOSIS — E861 Hypovolemia: Secondary | ICD-10-CM | POA: Diagnosis not present

## 2019-08-19 DIAGNOSIS — E271 Primary adrenocortical insufficiency: Secondary | ICD-10-CM | POA: Diagnosis not present

## 2019-08-19 DIAGNOSIS — R4182 Altered mental status, unspecified: Secondary | ICD-10-CM | POA: Diagnosis not present

## 2019-08-19 DIAGNOSIS — M79606 Pain in leg, unspecified: Secondary | ICD-10-CM | POA: Diagnosis not present

## 2019-08-19 DIAGNOSIS — D3500 Benign neoplasm of unspecified adrenal gland: Secondary | ICD-10-CM | POA: Diagnosis not present

## 2019-08-19 DIAGNOSIS — R05 Cough: Secondary | ICD-10-CM | POA: Diagnosis not present

## 2019-08-19 DIAGNOSIS — R5383 Other fatigue: Secondary | ICD-10-CM | POA: Diagnosis not present

## 2019-08-25 ENCOUNTER — Other Ambulatory Visit: Payer: Self-pay | Admitting: Cardiovascular Disease

## 2019-08-25 MED ORDER — LIDOCAINE 5 % EX PTCH
1.00 | MEDICATED_PATCH | CUTANEOUS | Status: DC
Start: 2019-08-26 — End: 2019-08-25

## 2019-08-25 MED ORDER — SENNOSIDES 8.6 MG PO TABS
2.00 | ORAL_TABLET | ORAL | Status: DC
Start: 2019-08-25 — End: 2019-08-25

## 2019-08-25 MED ORDER — GENERIC EXTERNAL MEDICATION
Status: DC
Start: ? — End: 2019-08-25

## 2019-08-25 MED ORDER — GUAIFENESIN 100 MG/5ML PO SYRP
200.00 | ORAL_SOLUTION | ORAL | Status: DC
Start: ? — End: 2019-08-25

## 2019-08-25 MED ORDER — METOPROLOL TARTRATE 25 MG PO TABS
12.50 | ORAL_TABLET | ORAL | Status: DC
Start: 2019-08-25 — End: 2019-08-25

## 2019-08-25 MED ORDER — CALCIUM CARBONATE 1250 (500 CA) MG PO CHEW
CHEWABLE_TABLET | ORAL | Status: DC
Start: ? — End: 2019-08-25

## 2019-08-25 MED ORDER — POLYETHYLENE GLYCOL 3350 17 GM/SCOOP PO POWD
17.00 | ORAL | Status: DC
Start: 2019-08-26 — End: 2019-08-25

## 2019-08-25 MED ORDER — NICOTINE 21 MG/24HR TD PT24
2.00 | MEDICATED_PATCH | TRANSDERMAL | Status: DC
Start: 2019-08-26 — End: 2019-08-25

## 2019-08-25 MED ORDER — MENTHOL 9.1 MG MT LOZG
1.00 | LOZENGE | OROMUCOSAL | Status: DC
Start: ? — End: 2019-08-25

## 2019-08-25 MED ORDER — ENOXAPARIN SODIUM 40 MG/0.4ML ~~LOC~~ SOLN
40.00 | SUBCUTANEOUS | Status: DC
Start: 2019-08-26 — End: 2019-08-25

## 2019-08-25 MED ORDER — ASPIRIN 81 MG PO CHEW
81.00 | CHEWABLE_TABLET | ORAL | Status: DC
Start: 2019-08-26 — End: 2019-08-25

## 2019-08-25 MED ORDER — ALBUTEROL SULFATE (2.5 MG/3ML) 0.083% IN NEBU
2.50 | INHALATION_SOLUTION | RESPIRATORY_TRACT | Status: DC
Start: ? — End: 2019-08-25

## 2019-08-25 MED ORDER — GABAPENTIN 300 MG PO CAPS
600.00 | ORAL_CAPSULE | ORAL | Status: DC
Start: 2019-08-25 — End: 2019-08-25

## 2019-08-25 MED ORDER — ACETAMINOPHEN 325 MG PO TABS
650.00 | ORAL_TABLET | ORAL | Status: DC
Start: ? — End: 2019-08-25

## 2019-08-25 MED ORDER — MELATONIN 3 MG PO TABS
6.00 | ORAL_TABLET | ORAL | Status: DC
Start: ? — End: 2019-08-25

## 2019-08-25 MED ORDER — THERA-M PO TABS
1.00 | ORAL_TABLET | ORAL | Status: DC
Start: 2019-08-26 — End: 2019-08-25

## 2019-08-25 MED ORDER — PRAVASTATIN SODIUM 40 MG PO TABS
80.00 | ORAL_TABLET | ORAL | Status: DC
Start: 2019-08-25 — End: 2019-08-25

## 2019-08-25 MED ORDER — THIAMINE HCL 100 MG PO TABS
200.00 | ORAL_TABLET | ORAL | Status: DC
Start: 2019-08-26 — End: 2019-08-25

## 2019-08-25 MED ORDER — PANTOPRAZOLE SODIUM 40 MG PO TBEC
40.00 | DELAYED_RELEASE_TABLET | ORAL | Status: DC
Start: 2019-08-26 — End: 2019-08-25

## 2019-08-25 MED ORDER — MAGNESIUM OXIDE 400 MG PO TABS
400.00 | ORAL_TABLET | ORAL | Status: DC
Start: 2019-08-25 — End: 2019-08-25

## 2019-08-25 MED ORDER — FOLIC ACID 1 MG PO TABS
1.00 | ORAL_TABLET | ORAL | Status: DC
Start: 2019-08-26 — End: 2019-08-25

## 2019-08-30 DIAGNOSIS — K7460 Unspecified cirrhosis of liver: Secondary | ICD-10-CM | POA: Diagnosis not present

## 2019-08-30 DIAGNOSIS — E871 Hypo-osmolality and hyponatremia: Secondary | ICD-10-CM | POA: Diagnosis not present

## 2019-08-30 DIAGNOSIS — E782 Mixed hyperlipidemia: Secondary | ICD-10-CM | POA: Diagnosis not present

## 2019-08-30 DIAGNOSIS — Z681 Body mass index (BMI) 19 or less, adult: Secondary | ICD-10-CM | POA: Diagnosis not present

## 2019-08-30 DIAGNOSIS — I10 Essential (primary) hypertension: Secondary | ICD-10-CM | POA: Diagnosis not present

## 2019-08-30 DIAGNOSIS — K219 Gastro-esophageal reflux disease without esophagitis: Secondary | ICD-10-CM | POA: Diagnosis not present

## 2019-08-30 DIAGNOSIS — E278 Other specified disorders of adrenal gland: Secondary | ICD-10-CM | POA: Diagnosis not present

## 2019-08-30 DIAGNOSIS — R918 Other nonspecific abnormal finding of lung field: Secondary | ICD-10-CM | POA: Diagnosis not present

## 2019-09-24 DIAGNOSIS — E782 Mixed hyperlipidemia: Secondary | ICD-10-CM | POA: Diagnosis not present

## 2019-09-24 DIAGNOSIS — E278 Other specified disorders of adrenal gland: Secondary | ICD-10-CM | POA: Diagnosis not present

## 2019-09-24 DIAGNOSIS — R918 Other nonspecific abnormal finding of lung field: Secondary | ICD-10-CM | POA: Diagnosis not present

## 2019-09-24 DIAGNOSIS — I10 Essential (primary) hypertension: Secondary | ICD-10-CM | POA: Diagnosis not present

## 2019-09-24 DIAGNOSIS — K219 Gastro-esophageal reflux disease without esophagitis: Secondary | ICD-10-CM | POA: Diagnosis not present

## 2019-09-24 DIAGNOSIS — K7460 Unspecified cirrhosis of liver: Secondary | ICD-10-CM | POA: Diagnosis not present

## 2019-09-24 DIAGNOSIS — E871 Hypo-osmolality and hyponatremia: Secondary | ICD-10-CM | POA: Diagnosis not present

## 2019-09-27 DIAGNOSIS — E271 Primary adrenocortical insufficiency: Secondary | ICD-10-CM | POA: Diagnosis not present

## 2019-09-27 DIAGNOSIS — E278 Other specified disorders of adrenal gland: Secondary | ICD-10-CM | POA: Diagnosis not present

## 2019-09-27 DIAGNOSIS — E871 Hypo-osmolality and hyponatremia: Secondary | ICD-10-CM | POA: Diagnosis not present

## 2019-09-27 DIAGNOSIS — E2740 Unspecified adrenocortical insufficiency: Secondary | ICD-10-CM | POA: Diagnosis not present

## 2019-10-02 DIAGNOSIS — E7800 Pure hypercholesterolemia, unspecified: Secondary | ICD-10-CM | POA: Diagnosis not present

## 2019-10-02 DIAGNOSIS — J449 Chronic obstructive pulmonary disease, unspecified: Secondary | ICD-10-CM | POA: Diagnosis not present

## 2019-10-02 DIAGNOSIS — E782 Mixed hyperlipidemia: Secondary | ICD-10-CM | POA: Diagnosis not present

## 2019-10-02 DIAGNOSIS — I10 Essential (primary) hypertension: Secondary | ICD-10-CM | POA: Diagnosis not present

## 2019-10-02 DIAGNOSIS — E1165 Type 2 diabetes mellitus with hyperglycemia: Secondary | ICD-10-CM | POA: Diagnosis not present

## 2019-10-02 DIAGNOSIS — E1159 Type 2 diabetes mellitus with other circulatory complications: Secondary | ICD-10-CM | POA: Diagnosis not present

## 2019-10-02 DIAGNOSIS — E871 Hypo-osmolality and hyponatremia: Secondary | ICD-10-CM | POA: Diagnosis not present

## 2019-10-02 DIAGNOSIS — N189 Chronic kidney disease, unspecified: Secondary | ICD-10-CM | POA: Diagnosis not present

## 2019-10-04 DIAGNOSIS — E1159 Type 2 diabetes mellitus with other circulatory complications: Secondary | ICD-10-CM | POA: Diagnosis not present

## 2019-10-04 DIAGNOSIS — R202 Paresthesia of skin: Secondary | ICD-10-CM | POA: Diagnosis not present

## 2019-10-04 DIAGNOSIS — Z681 Body mass index (BMI) 19 or less, adult: Secondary | ICD-10-CM | POA: Diagnosis not present

## 2019-10-04 DIAGNOSIS — R918 Other nonspecific abnormal finding of lung field: Secondary | ICD-10-CM | POA: Diagnosis not present

## 2019-10-04 DIAGNOSIS — E278 Other specified disorders of adrenal gland: Secondary | ICD-10-CM | POA: Diagnosis not present

## 2019-10-04 DIAGNOSIS — J449 Chronic obstructive pulmonary disease, unspecified: Secondary | ICD-10-CM | POA: Diagnosis not present

## 2019-10-04 DIAGNOSIS — I10 Essential (primary) hypertension: Secondary | ICD-10-CM | POA: Diagnosis not present

## 2019-10-04 DIAGNOSIS — I708 Atherosclerosis of other arteries: Secondary | ICD-10-CM | POA: Diagnosis not present

## 2019-10-29 DIAGNOSIS — E1140 Type 2 diabetes mellitus with diabetic neuropathy, unspecified: Secondary | ICD-10-CM | POA: Diagnosis not present

## 2019-10-29 DIAGNOSIS — I2510 Atherosclerotic heart disease of native coronary artery without angina pectoris: Secondary | ICD-10-CM | POA: Diagnosis not present

## 2019-10-29 DIAGNOSIS — F17210 Nicotine dependence, cigarettes, uncomplicated: Secondary | ICD-10-CM | POA: Diagnosis not present

## 2019-10-29 DIAGNOSIS — E876 Hypokalemia: Secondary | ICD-10-CM | POA: Diagnosis not present

## 2019-10-29 DIAGNOSIS — R062 Wheezing: Secondary | ICD-10-CM | POA: Diagnosis not present

## 2019-10-29 DIAGNOSIS — R0781 Pleurodynia: Secondary | ICD-10-CM | POA: Diagnosis not present

## 2019-10-29 DIAGNOSIS — Z7982 Long term (current) use of aspirin: Secondary | ICD-10-CM | POA: Diagnosis not present

## 2019-10-29 DIAGNOSIS — E1151 Type 2 diabetes mellitus with diabetic peripheral angiopathy without gangrene: Secondary | ICD-10-CM | POA: Diagnosis not present

## 2019-10-29 DIAGNOSIS — I10 Essential (primary) hypertension: Secondary | ICD-10-CM | POA: Diagnosis not present

## 2019-10-29 DIAGNOSIS — I499 Cardiac arrhythmia, unspecified: Secondary | ICD-10-CM | POA: Diagnosis not present

## 2019-10-29 DIAGNOSIS — Z951 Presence of aortocoronary bypass graft: Secondary | ICD-10-CM | POA: Diagnosis not present

## 2019-11-01 DIAGNOSIS — E876 Hypokalemia: Secondary | ICD-10-CM | POA: Diagnosis not present

## 2019-11-01 DIAGNOSIS — E2740 Unspecified adrenocortical insufficiency: Secondary | ICD-10-CM | POA: Diagnosis not present

## 2019-11-01 DIAGNOSIS — Z125 Encounter for screening for malignant neoplasm of prostate: Secondary | ICD-10-CM | POA: Diagnosis not present

## 2019-11-01 DIAGNOSIS — Z681 Body mass index (BMI) 19 or less, adult: Secondary | ICD-10-CM | POA: Diagnosis not present

## 2019-11-01 DIAGNOSIS — R634 Abnormal weight loss: Secondary | ICD-10-CM | POA: Diagnosis not present

## 2019-11-01 DIAGNOSIS — E278 Other specified disorders of adrenal gland: Secondary | ICD-10-CM | POA: Diagnosis not present

## 2019-11-14 DIAGNOSIS — E1151 Type 2 diabetes mellitus with diabetic peripheral angiopathy without gangrene: Secondary | ICD-10-CM | POA: Diagnosis not present

## 2019-11-14 DIAGNOSIS — I10 Essential (primary) hypertension: Secondary | ICD-10-CM | POA: Diagnosis not present

## 2019-11-14 DIAGNOSIS — E278 Other specified disorders of adrenal gland: Secondary | ICD-10-CM | POA: Diagnosis not present

## 2019-11-14 DIAGNOSIS — E1140 Type 2 diabetes mellitus with diabetic neuropathy, unspecified: Secondary | ICD-10-CM | POA: Diagnosis not present

## 2019-11-14 DIAGNOSIS — C7491 Malignant neoplasm of unspecified part of right adrenal gland: Secondary | ICD-10-CM | POA: Diagnosis not present

## 2019-11-14 DIAGNOSIS — Z87891 Personal history of nicotine dependence: Secondary | ICD-10-CM | POA: Diagnosis not present

## 2019-11-14 DIAGNOSIS — Z888 Allergy status to other drugs, medicaments and biological substances status: Secondary | ICD-10-CM | POA: Diagnosis not present

## 2019-11-14 DIAGNOSIS — I2510 Atherosclerotic heart disease of native coronary artery without angina pectoris: Secondary | ICD-10-CM | POA: Diagnosis not present

## 2019-11-14 DIAGNOSIS — Z79899 Other long term (current) drug therapy: Secondary | ICD-10-CM | POA: Diagnosis not present

## 2019-11-14 DIAGNOSIS — R590 Localized enlarged lymph nodes: Secondary | ICD-10-CM | POA: Diagnosis not present

## 2019-11-21 DIAGNOSIS — R109 Unspecified abdominal pain: Secondary | ICD-10-CM | POA: Diagnosis not present

## 2019-11-21 DIAGNOSIS — G8194 Hemiplegia, unspecified affecting left nondominant side: Secondary | ICD-10-CM | POA: Diagnosis not present

## 2019-11-21 DIAGNOSIS — I619 Nontraumatic intracerebral hemorrhage, unspecified: Secondary | ICD-10-CM | POA: Diagnosis not present

## 2019-11-21 DIAGNOSIS — K7460 Unspecified cirrhosis of liver: Secondary | ICD-10-CM | POA: Diagnosis not present

## 2019-11-21 DIAGNOSIS — E2740 Unspecified adrenocortical insufficiency: Secondary | ICD-10-CM | POA: Diagnosis not present

## 2019-11-21 DIAGNOSIS — R05 Cough: Secondary | ICD-10-CM | POA: Diagnosis not present

## 2019-11-21 DIAGNOSIS — I613 Nontraumatic intracerebral hemorrhage in brain stem: Secondary | ICD-10-CM | POA: Diagnosis not present

## 2019-11-21 DIAGNOSIS — R633 Feeding difficulties: Secondary | ICD-10-CM | POA: Diagnosis not present

## 2019-11-21 DIAGNOSIS — Z452 Encounter for adjustment and management of vascular access device: Secondary | ICD-10-CM | POA: Diagnosis not present

## 2019-11-21 DIAGNOSIS — G629 Polyneuropathy, unspecified: Secondary | ICD-10-CM | POA: Diagnosis not present

## 2019-11-21 DIAGNOSIS — E1142 Type 2 diabetes mellitus with diabetic polyneuropathy: Secondary | ICD-10-CM | POA: Diagnosis not present

## 2019-11-21 DIAGNOSIS — E1151 Type 2 diabetes mellitus with diabetic peripheral angiopathy without gangrene: Secondary | ICD-10-CM | POA: Diagnosis not present

## 2019-11-21 DIAGNOSIS — Z4682 Encounter for fitting and adjustment of non-vascular catheter: Secondary | ICD-10-CM | POA: Diagnosis not present

## 2019-11-21 DIAGNOSIS — E119 Type 2 diabetes mellitus without complications: Secondary | ICD-10-CM | POA: Diagnosis not present

## 2019-11-21 DIAGNOSIS — R4781 Slurred speech: Secondary | ICD-10-CM | POA: Diagnosis not present

## 2019-11-21 DIAGNOSIS — C7490 Malignant neoplasm of unspecified part of unspecified adrenal gland: Secondary | ICD-10-CM | POA: Diagnosis not present

## 2019-11-21 DIAGNOSIS — G8190 Hemiplegia, unspecified affecting unspecified side: Secondary | ICD-10-CM | POA: Diagnosis not present

## 2019-11-21 DIAGNOSIS — Z794 Long term (current) use of insulin: Secondary | ICD-10-CM | POA: Diagnosis not present

## 2019-11-21 DIAGNOSIS — R0689 Other abnormalities of breathing: Secondary | ICD-10-CM | POA: Diagnosis not present

## 2019-11-21 DIAGNOSIS — G936 Cerebral edema: Secondary | ICD-10-CM | POA: Diagnosis not present

## 2019-11-21 DIAGNOSIS — Z20822 Contact with and (suspected) exposure to covid-19: Secondary | ICD-10-CM | POA: Diagnosis not present

## 2019-11-21 DIAGNOSIS — R918 Other nonspecific abnormal finding of lung field: Secondary | ICD-10-CM | POA: Diagnosis not present

## 2019-11-21 DIAGNOSIS — K922 Gastrointestinal hemorrhage, unspecified: Secondary | ICD-10-CM | POA: Diagnosis not present

## 2019-11-21 DIAGNOSIS — R911 Solitary pulmonary nodule: Secondary | ICD-10-CM | POA: Diagnosis not present

## 2019-11-21 DIAGNOSIS — R509 Fever, unspecified: Secondary | ICD-10-CM | POA: Diagnosis not present

## 2019-11-21 DIAGNOSIS — R1313 Dysphagia, pharyngeal phase: Secondary | ICD-10-CM | POA: Diagnosis not present

## 2019-11-21 DIAGNOSIS — Z720 Tobacco use: Secondary | ICD-10-CM | POA: Diagnosis not present

## 2019-11-21 DIAGNOSIS — I10 Essential (primary) hypertension: Secondary | ICD-10-CM | POA: Diagnosis not present

## 2019-11-29 DEATH — deceased
